# Patient Record
Sex: Male | Born: 1975 | Race: White | Hispanic: No | Marital: Married | Smoking: Never smoker
Health system: Southern US, Community
[De-identification: ages and names within clinical notes are randomized; demographics above are authoritative.]

## PROBLEM LIST (undated history)

## (undated) DIAGNOSIS — J189 Pneumonia, unspecified organism: Secondary | ICD-10-CM

## (undated) DIAGNOSIS — F329 Major depressive disorder, single episode, unspecified: Secondary | ICD-10-CM

## (undated) DIAGNOSIS — F32A Depression, unspecified: Secondary | ICD-10-CM

## (undated) HISTORY — DX: Major depressive disorder, single episode, unspecified: F32.9

## (undated) HISTORY — PX: WISDOM TOOTH EXTRACTION: SHX21

## (undated) HISTORY — DX: Depression, unspecified: F32.A

---

## 2014-03-14 ENCOUNTER — Ambulatory Visit (INDEPENDENT_AMBULATORY_CARE_PROVIDER_SITE_OTHER): Payer: No Typology Code available for payment source | Admitting: Family

## 2014-03-14 ENCOUNTER — Encounter: Payer: Self-pay | Admitting: Family

## 2014-03-14 ENCOUNTER — Ambulatory Visit (INDEPENDENT_AMBULATORY_CARE_PROVIDER_SITE_OTHER): Payer: No Typology Code available for payment source | Admitting: *Deleted

## 2014-03-14 VITALS — BP 118/90 | HR 64 | Temp 97.6°F | Resp 16 | Ht 68.75 in | Wt 186.8 lb

## 2014-03-14 DIAGNOSIS — F329 Major depressive disorder, single episode, unspecified: Secondary | ICD-10-CM

## 2014-03-14 DIAGNOSIS — Z23 Encounter for immunization: Secondary | ICD-10-CM

## 2014-03-14 DIAGNOSIS — K429 Umbilical hernia without obstruction or gangrene: Secondary | ICD-10-CM

## 2014-03-14 DIAGNOSIS — F32A Depression, unspecified: Secondary | ICD-10-CM

## 2014-03-14 MED ORDER — CITALOPRAM HYDROBROMIDE 20 MG PO TABS: 20.0000 mg | ORAL_TABLET | Freq: Every day | ORAL | Status: DC

## 2014-03-14 NOTE — Assessment & Plan Note (Signed)
New. Will refer to general surgeon for possible repair. Reviewed signs/symptoms of strangulated hernia and pt is advised to go to the ED if this occurs.

## 2014-03-14 NOTE — Progress Notes (Signed)
Pre visit review using our clinic review tool, if applicable. No additional management support is needed unless otherwise documented below in the visit note. 

## 2014-03-14 NOTE — Assessment & Plan Note (Signed)
Stable on citalopram, continue same.  

## 2014-03-14 NOTE — Progress Notes (Signed)
Subjective:    Patient ID: Brad Hill, male    DOB: 1975-05-23, 38 y.o.   MRN: [id number]  HPI  Brad Hill is a 38 yr old male who presents today to establish care.   "lump" above navel- reports that he first noticed this about 6 weeks ago. Reports pain worsens with bending to tie shoe.  Denies associated nausea/vomitting. Severity is minor.   Depression- reports that he has been on it for 4 years.  Tried to stop 4 years ago but wife noted that he was "gloomy". Reports feeling well. Denies panic attacks.     Review of Systems  Constitutional: Negative for unexpected weight change.  HENT: Negative for rhinorrhea.   Respiratory: Negative for cough.   Gastrointestinal: Negative for nausea, vomiting and diarrhea.  Genitourinary: Negative for dysuria and frequency.  Musculoskeletal: Positive for joint swelling. Negative for arthralgias.  Skin: Negative for rash.  Psychiatric/Behavioral:       See HPI   Past Medical History  Diagnosis Date  . Depression     History   Social History  . Marital Status: Married    Spouse Name: N/A    Number of Children: N/A  . Years of Education: N/A   Occupational History  . Not on file.   Social History Main Topics  . Smoking status: Never Smoker   . Smokeless tobacco: Never Used  . Alcohol Use: 0.0 oz/week    0 Not specified per week     Comment: occasional "less than once a week"  . Drug Use: Not on file  . Sexual Activity: Not on file   Other Topics Concern  . 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 Wisdom tooth extraction      Family History  Problem Relation Age of Onset  . Hypertension Mother   . Cancer Father     prostate-  living, s/p surgery    Allergies  Allergen Reactions  . Erythromycin Nausea Only    No current outpatient prescriptions on file prior to visit.   No current facility-administered medications on file prior to visit.    BP 118/90 mmHg  Pulse 64  Temp(Src) 97.6 F (36.4 C) (Oral)  Resp 16  Ht 5' 8.75" (1.746 m)  Wt 186 lb 12.8 oz (84.732 kg)  BMI 27.79 kg/m2  SpO2 98%       Objective:   Physical Exam  Constitutional: He is oriented to person, place, and time. He appears well-developed and well-nourished. No distress.  HENT:  Head: Normocephalic and atraumatic.  Cardiovascular: Normal rate and regular rhythm.   No murmur heard. Pulmonary/Chest: Effort normal and breath sounds normal. No respiratory distress. He has no wheezes. He has no rales. He exhibits no tenderness.  Abdominal: Soft. He exhibits no distension. There is no tenderness.  Small reducible umbilical hernia is noted  Neurological: He is alert and oriented to person, place, and time.  Skin: Skin is warm and dry.  Psychiatric: He has a normal mood and affect. His behavior is normal. Judgment and thought content normal.         Assessment & Plan:

## 2014-03-14 NOTE — Patient Instructions (Signed)
You will be contacted about your referral to see the Surgeon. Please schedule a fasting physical at the front desk. In regards to your hernia- please go to ER if severe abdominal pain/nausea/vomitting occurs. Welcome to [facility]!

## 2014-04-29 HISTORY — PX: UMBILICAL HERNIA REPAIR: SHX196

## 2014-05-11 ENCOUNTER — Encounter: Payer: No Typology Code available for payment source | Admitting: Family

## 2014-09-05 ENCOUNTER — Other Ambulatory Visit: Payer: Self-pay | Admitting: Family

## 2014-09-06 NOTE — Telephone Encounter (Signed)
Pt last seen by PCP 02/2014 and advised fasting physical. Pt has not scheduled.  Please call pt to scheduled fasting physical at his earliest convenience.  Thanks!

## 2014-09-06 NOTE — Telephone Encounter (Signed)
Left message advising pt to schedule physical appointment awaiting call

## 2014-09-13 NOTE — Telephone Encounter (Signed)
Mailed letter to pt

## 2014-11-22 ENCOUNTER — Ambulatory Visit (INDEPENDENT_AMBULATORY_CARE_PROVIDER_SITE_OTHER): Payer: No Typology Code available for payment source | Admitting: Family

## 2014-11-22 ENCOUNTER — Ambulatory Visit (HOSPITAL_BASED_OUTPATIENT_CLINIC_OR_DEPARTMENT_OTHER)
Admission: RE | Admit: 2014-11-22 | Discharge: 2014-11-22 | Disposition: A | Discharge status: Home / Self Care | Payer: No Typology Code available for payment source | Source: Ambulatory Visit | Attending: Family | Admitting: Family

## 2014-11-22 ENCOUNTER — Encounter: Payer: Self-pay | Admitting: Family

## 2014-11-22 VITALS — BP 122/90 | HR 68 | Temp 98.0°F | Resp 16 | Ht 69.0 in | Wt 192.8 lb

## 2014-11-22 DIAGNOSIS — M546 Pain in thoracic spine: Secondary | ICD-10-CM | POA: Diagnosis not present

## 2014-11-22 MED ORDER — MELOXICAM 7.5 MG PO TABS: 7.5000 mg | ORAL_TABLET | Freq: Every day | ORAL | Status: DC

## 2014-11-22 NOTE — Progress Notes (Signed)
Pre visit review using our clinic review tool, if applicable. No additional management support is needed unless otherwise documented below in the visit note. 

## 2014-11-22 NOTE — Patient Instructions (Signed)
Please complete x ray on the first floor. Start meloxicam (anti-inflammatory). Call if back pain worsens or if not improved in 1 month.  Schedule physical at the front desk.

## 2014-11-22 NOTE — Assessment & Plan Note (Signed)
X ray of thoracic spine is unremarkable. Trial of meloxicam, pt to follow up if symptoms worsen or do not improve.

## 2014-11-22 NOTE — Progress Notes (Signed)
   Subjective:    Patient ID: Brad Hill, male    DOB: Jan 19, 1976, 39 y.o.   MRN: [id number]  HPI  Mr. Brad Hill is a 39 yr old male tho presents today with complaint of back pain.  Reports that he develops back pain center of his back with prolonged sitting.  Feels like something is going "right through is back" to above the navel.  First noticed these symptoms a few months ago. Pain is intermittent.  Improved with standing.   Review of Systems See HPI  Past Medical History  Diagnosis Date  . Depression     History   Social History  . Marital Status: Married    Spouse Name: N/A  . Number of Children: N/A  . Years of Education: N/A   Occupational History  . Not on file.   Social History Main Topics  . Smoking status: Never Smoker   . Smokeless tobacco: Never Used  . Alcohol Use: 0.0 oz/week    0 Standard drinks or equivalent per week     Comment: occasional "less than once a week"  . Drug Use: Not on file  . Sexual Activity: Not on file   Other Topics Concern  . 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 Wisdom tooth extraction    . Umbilical hernia repair  1/16    Family History  Problem Relation Age of Onset  . Hypertension Mother   . Cancer Father     prostate- living, s/p surgery    Allergies  Allergen Reactions  . Erythromycin Nausea Only    Current Outpatient Prescriptions on File Prior to Visit  Medication Sig Dispense Refill  . citalopram (CELEXA) 20 MG tablet TAKE ONE TABLET BY MOUTH ONE TIME DAILY 90 tablet 0   No current facility-administered medications on file prior to visit.    BP 122/90 mmHg  Pulse 68  Temp(Src) 98 F (36.7 C) (Oral)  Resp 16  Ht 5'  9" (1.753 m)  Wt 192 lb 12.8 oz (87.454 kg)  BMI 28.46 kg/m2  SpO2 98%       Objective:   Physical Exam  Constitutional: He is oriented to person, place, and time. He appears well-developed and well-nourished. No distress.  HENT:  Head: Normocephalic and atraumatic.  Cardiovascular: Normal rate and regular rhythm.   No murmur heard. Pulmonary/Chest: Effort normal and breath sounds normal. No respiratory distress. He has no wheezes. He has no rales.  Abdominal: Soft. He exhibits no distension and no mass. There is no tenderness.  Musculoskeletal: He exhibits no edema.  Mild tenderness right side of lower thoracic spine Neg straight leg raise bilaterally  Neurological: He is alert and oriented to person, place, and time.  Reflex Scores:      Patellar reflexes are 3+ on the right side and 3+ on the left side. Bilateral LE strength is 5/5  Skin: Skin is warm and dry.  Psychiatric: He has a normal mood and affect. His behavior is normal. Thought content normal.          Assessment & Plan:

## 2014-12-12 ENCOUNTER — Ambulatory Visit (INDEPENDENT_AMBULATORY_CARE_PROVIDER_SITE_OTHER): Payer: No Typology Code available for payment source | Admitting: Family

## 2014-12-12 ENCOUNTER — Encounter: Payer: Self-pay | Admitting: Family

## 2014-12-12 VITALS — BP 120/88 | HR 85 | Temp 97.8°F | Resp 16 | Ht 69.5 in | Wt 191.2 lb

## 2014-12-12 DIAGNOSIS — F329 Major depressive disorder, single episode, unspecified: Secondary | ICD-10-CM

## 2014-12-12 DIAGNOSIS — R7989 Other specified abnormal findings of blood chemistry: Secondary | ICD-10-CM | POA: Diagnosis not present

## 2014-12-12 DIAGNOSIS — Z23 Encounter for immunization: Secondary | ICD-10-CM

## 2014-12-12 DIAGNOSIS — Z0000 Encounter for general adult medical examination without abnormal findings: Secondary | ICD-10-CM

## 2014-12-12 DIAGNOSIS — F32A Depression, unspecified: Secondary | ICD-10-CM

## 2014-12-12 LAB — URINALYSIS, ROUTINE W REFLEX MICROSCOPIC
Bilirubin Urine: NEGATIVE
KETONES UR: NEGATIVE
LEUKOCYTES UA: NEGATIVE
NITRITE: NEGATIVE
Specific Gravity, Urine: 1.01 (ref 1.000–1.030)
TOTAL PROTEIN, URINE-UPE24: NEGATIVE
URINE GLUCOSE: NEGATIVE
UROBILINOGEN UA: 0.2 (ref 0.0–1.0)
WBC, UA: NONE SEEN (ref 0–?)
pH: 6 (ref 5.0–8.0)

## 2014-12-12 LAB — LIPID PANEL
CHOL/HDL RATIO: 7
Cholesterol: 223 mg/dL — ABNORMAL HIGH (ref 0–200)
HDL: 33.9 mg/dL — AB (ref >39.00)
Triglycerides: 457 mg/dL — ABNORMAL HIGH (ref 0.0–149.0)

## 2014-12-12 LAB — HEPATIC FUNCTION PANEL
ALBUMIN: 4.5 g/dL (ref 3.5–5.2)
ALT: 35 U/L (ref 0–53)
AST: 27 U/L (ref 0–37)
Alkaline Phosphatase: 77 U/L (ref 39–117)
BILIRUBIN TOTAL: 0.5 mg/dL (ref 0.2–1.2)
Bilirubin, Direct: 0.1 mg/dL (ref 0.0–0.3)
Total Protein: 7.5 g/dL (ref 6.0–8.3)

## 2014-12-12 LAB — BASIC METABOLIC PANEL
BUN: 13 mg/dL (ref 6–23)
CHLORIDE: 104 meq/L (ref 96–112)
CO2: 27 mEq/L (ref 19–32)
CREATININE: 0.97 mg/dL (ref 0.40–1.50)
Calcium: 9.3 mg/dL (ref 8.4–10.5)
GFR: 91.12 mL/min (ref >60.00)
GLUCOSE: 84 mg/dL (ref 70–99)
Potassium: 3.8 mEq/L (ref 3.5–5.1)
Sodium: 138 mEq/L (ref 135–145)

## 2014-12-12 LAB — CBC WITH DIFFERENTIAL/PLATELET
Basophils Absolute: 0 10*3/uL (ref 0.0–0.1)
Basophils Relative: 0.4 % (ref 0.0–3.0)
EOS ABS: 0.2 10*3/uL (ref 0.0–0.7)
Eosinophils Relative: 2.9 % (ref 0.0–5.0)
HCT: 44.3 % (ref 39.0–52.0)
HEMOGLOBIN: 15 g/dL (ref 13.0–17.0)
Lymphocytes Relative: 32.9 % (ref 12.0–46.0)
Lymphs Abs: 2.5 10*3/uL (ref 0.7–4.0)
MCHC: 33.9 g/dL (ref 30.0–36.0)
MCV: 87.1 fl (ref 78.0–100.0)
MONO ABS: 0.7 10*3/uL (ref 0.1–1.0)
Monocytes Relative: 9.5 % (ref 3.0–12.0)
Neutro Abs: 4.2 10*3/uL (ref 1.4–7.7)
Neutrophils Relative %: 54.3 % (ref 43.0–77.0)
PLATELETS: 315 10*3/uL (ref 150.0–400.0)
RBC: 5.09 Mil/uL (ref 4.22–5.81)
RDW: 13 % (ref 11.5–15.5)
WBC: 7.7 10*3/uL (ref 4.0–10.5)

## 2014-12-12 LAB — LDL CHOLESTEROL, DIRECT: Direct LDL: 95 mg/dL

## 2014-12-12 LAB — TSH: TSH: 1.14 u[IU]/mL (ref 0.35–4.50)

## 2014-12-12 NOTE — Addendum Note (Signed)
Addended by: Mervin Kung A on: 12/12/2014 04:11 PM   Modules accepted: Orders

## 2014-12-12 NOTE — Progress Notes (Signed)
Subjective:    Patient ID: Brad Hill, male    DOB: Sep 02, 1975, 39 y.o.   MRN: [id number]  HPI  Patient presents today for complete physical.  Immunizations:tdap due Diet: fair, needs to eat more veggies/fruit Exercise: some but not  Dad had prostate CA at age 16.  Both grandfathers had prostate cancer in their 80's.    Reports that he had a stressful life event 4-5 years ago and started citalopram.  Weaned himself off for 6-9 months 1 year ago.  Wife thought that he needed to go back on "was mopey."    Review of Systems  Constitutional: Negative for unexpected weight change.  HENT: Negative for rhinorrhea.   Respiratory: Negative for shortness of breath.        Reports mild cough last couple of days  Cardiovascular: Negative for chest pain.  Gastrointestinal: Negative for diarrhea, constipation and blood in stool.  Genitourinary: Negative for dysuria and frequency.  Musculoskeletal: Negative for myalgias and arthralgias.       Saw chiropractor for back pain- chiropractor was concerned if it could be gallbladder  Skin: Negative for rash.  Neurological:       Rare headaches  Hematological: Negative for adenopathy.  Psychiatric/Behavioral:       Denies depression/anxiety    Past Medical History  Diagnosis Date  . Depression     Social History   Social History  . Marital Status: Married    Spouse Name: N/A  . Number of Children: N/A  . Years of Education: N/A   Occupational History  . Not on file.   Social History Main Topics  . Smoking status: Never Smoker   . Smokeless tobacco: Never Used  . Alcohol Use: 0.0 oz/week    0 Standard drinks or equivalent per week     Comment: occasional "less than once a week"  . Drug Use: Not on file  . Sexual Activity: Not on file   Other Topics Concern  . 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 Wisdom tooth extraction    . Umbilical hernia repair  1/16    Family History  Problem Relation Age of Onset  . Hypertension Mother   . Cancer Father 30    prostate- living, s/p surgery  . Cancer Maternal Grandfather 6    prostate  . Cancer Paternal Grandfather 47    prostate     Allergies  Allergen Reactions  . Erythromycin Nausea Only    Current Outpatient Prescriptions on File Prior to Visit  Medication Sig Dispense Refill  . citalopram (CELEXA) 20 MG tablet TAKE ONE TABLET BY MOUTH ONE TIME DAILY 90 tablet 0  . meloxicam (MOBIC) 7.5 MG tablet Take 1 tablet (7.5 mg total) by mouth daily. 14 tablet 0   No current facility-administered medications on file prior to visit.    BP 120/88 mmHg  Pulse 85  Temp(Src) 97.8 F (36.6 C) (Oral)  Resp 16  Ht 5' 9.5" (1.765 m)  Wt 191 lb 3.2 oz (86.728 kg)  BMI 27.84 kg/m2  SpO2 98%       Objective:   Physical Exam  Physical Exam  Constitutional: He is oriented to person, place, and time. He appears well-developed and well-nourished. No distress.  HENT:  Head: Normocephalic and atraumatic.  Right Ear: Tympanic membrane and ear canal normal.  Left Ear: Tympanic membrane and ear canal normal.  Mouth/Throat: Oropharynx is clear and moist.  Eyes: Pupils are equal, round, and reactive to light. No scleral icterus.  Neck: Normal range of motion. No thyromegaly present.  Cardiovascular: Normal rate and regular rhythm.   No murmur heard. Pulmonary/Chest: Effort normal and breath sounds normal. No respiratory distress. He has no wheezes. He has no rales. He exhibits no tenderness.  Abdominal: Soft. Bowel sounds are normal. He exhibits no distension and no mass. There is no tenderness. There is no rebound and no guarding.  Musculoskeletal: He exhibits no edema.    Lymphadenopathy:    He has no cervical adenopathy.  Neurological: He is alert and oriented to person, place, and time. He has normal patellar reflexes. He exhibits normal muscle tone. Coordination normal.  Skin: Skin is warm and dry.  Psychiatric: He has a normal mood and affect. His behavior is normal. Judgment and thought content normal.          Assessment & Plan:         Assessment & Plan:

## 2014-12-12 NOTE — Assessment & Plan Note (Signed)
Immunizations reviewed- Tdap given today. Discussed healthy diet, exercise, weight loss.  Obtain routine labs.Pt requests PSA.

## 2014-12-12 NOTE — Assessment & Plan Note (Signed)
He wishes to continue citalopram. Advised pt to let me know if he decides to try to come off in the future and we can arrange a taper plan.

## 2014-12-12 NOTE — Progress Notes (Signed)
Pre visit review using our clinic review tool, if applicable. No additional management support is needed unless otherwise documented below in the visit note. 

## 2014-12-12 NOTE — Patient Instructions (Signed)
Try to get 30 minutes of exercise 5 days a week. Work on [employer], more fruits and veggies, less soda. Please complete lab work prior to leaving.

## 2014-12-17 ENCOUNTER — Telehealth: Payer: Self-pay | Admitting: Family

## 2014-12-17 DIAGNOSIS — E781 Pure hyperglyceridemia: Secondary | ICD-10-CM | POA: Insufficient documentation

## 2014-12-17 MED ORDER — ATORVASTATIN CALCIUM 20 MG PO TABS: 20.0000 mg | ORAL_TABLET | Freq: Every day | ORAL | Status: DC

## 2014-12-17 NOTE — Telephone Encounter (Signed)
Triglycerides are very elevated. I would like pt to start atorvastatin and dietary changes:  work on avoiding concentrated sweets, and limiting white carbs (rice/bread/pasta/potatoes). Instead substitute whole grain versions with reasonable portions. Repeat flp in 3 months, dx hypertriglyceridemia.  Still waiting on PSA, did lab add this on?

## 2014-12-19 NOTE — Telephone Encounter (Signed)
PSA not run.  Too late to add. Will ask pt to return for redraw.

## 2014-12-20 NOTE — Telephone Encounter (Signed)
Notified pt of below results and he voices understanding. Scheduled lab appt for 03/28/15 at 7am.  Future lab orders entered. Per pt, he asked the lab to cancel PSA until he checked for coverage with his insurance company.  States he will call us back when he is ready to schedule the lab draw.

## 2015-01-09 ENCOUNTER — Other Ambulatory Visit: Payer: Self-pay | Admitting: Family

## 2015-03-28 ENCOUNTER — Other Ambulatory Visit: Payer: No Typology Code available for payment source

## 2015-04-20 ENCOUNTER — Other Ambulatory Visit: Payer: Self-pay | Admitting: Family

## 2015-05-17 ENCOUNTER — Other Ambulatory Visit: Payer: Self-pay | Admitting: Family

## 2015-06-14 ENCOUNTER — Ambulatory Visit: Payer: No Typology Code available for payment source | Admitting: Family

## 2015-06-20 ENCOUNTER — Ambulatory Visit (INDEPENDENT_AMBULATORY_CARE_PROVIDER_SITE_OTHER): Payer: BLUE CROSS/BLUE SHIELD | Admitting: Family

## 2015-06-20 ENCOUNTER — Other Ambulatory Visit: Payer: Self-pay | Admitting: Family

## 2015-06-20 ENCOUNTER — Encounter: Payer: Self-pay | Admitting: Family

## 2015-06-20 VITALS — BP 128/84 | HR 66 | Temp 97.4°F | Resp 16 | Ht 69.5 in | Wt 193.8 lb

## 2015-06-20 DIAGNOSIS — F329 Major depressive disorder, single episode, unspecified: Secondary | ICD-10-CM

## 2015-06-20 DIAGNOSIS — F32A Depression, unspecified: Secondary | ICD-10-CM

## 2015-06-20 DIAGNOSIS — Z8042 Family history of malignant neoplasm of prostate: Secondary | ICD-10-CM | POA: Diagnosis not present

## 2015-06-20 DIAGNOSIS — E785 Hyperlipidemia, unspecified: Secondary | ICD-10-CM | POA: Diagnosis not present

## 2015-06-20 DIAGNOSIS — E781 Pure hyperglyceridemia: Secondary | ICD-10-CM

## 2015-06-20 LAB — PSA: PSA: 0.72 ng/mL (ref 0.10–4.00)

## 2015-06-20 LAB — LIPID PANEL
CHOL/HDL RATIO: 4
Cholesterol: 151 mg/dL (ref 0–200)
HDL: 34.1 mg/dL — AB (ref >39.00)
NonHDL: 117.36
Triglycerides: 284 mg/dL — ABNORMAL HIGH (ref 0.0–149.0)
VLDL: 56.8 mg/dL — AB (ref 0.0–40.0)

## 2015-06-20 LAB — LDL CHOLESTEROL, DIRECT: LDL DIRECT: 72 mg/dL

## 2015-06-20 MED ORDER — CITALOPRAM HYDROBROMIDE 20 MG PO TABS: 20.0000 mg | ORAL_TABLET | Freq: Every day | ORAL | Status: DC

## 2015-06-20 MED ORDER — ATORVASTATIN CALCIUM 20 MG PO TABS: ORAL_TABLET | ORAL | Status: DC

## 2015-06-20 NOTE — Patient Instructions (Signed)
Please complete lab work prior to leaving.   

## 2015-06-20 NOTE — Assessment & Plan Note (Signed)
Obtain follow up lipid panel.  

## 2015-06-20 NOTE — Assessment & Plan Note (Signed)
Stable on citalopram, continue same.  

## 2015-06-20 NOTE — Progress Notes (Signed)
Pre visit review using our clinic review tool, if applicable. No additional management support is needed unless otherwise documented below in the visit note. 

## 2015-06-20 NOTE — Telephone Encounter (Signed)
Trigs are improving but still elevated.  Add tricor, add fish oil  bid, continue to work on dietary changes. Repeat in 3 mos fasting.

## 2015-06-20 NOTE — Progress Notes (Signed)
Subjective:    Patient ID: Brad Hill, male    DOB: 06-30-1975, 40 y.o.   MRN: [id number]  HPI  Pt presents today for follow up of his depression.  Reports that he continues citalopram and depression remains well controlled.   Hyperlipidemia- denies myalgia, tolerating lipitor. Reports that he has decreased soda intake.  Not exercising.  Lab Results  Component Value Date   CHOL 223* 12/12/2014   HDL 33.90* 12/12/2014   LDLDIRECT 95.0 12/12/2014   TRIG * 12/12/2014    457.0 Triglyceride is over 400; calculations on Lipids are invalid.   CHOLHDL 7 12/12/2014   Reports dad had prostate CA in his 74's pt would like PSA checked.    Review of Systems    see HPI  Past Medical History  Diagnosis Date  . Depression     Social History   Social History  . Marital Status: Married    Spouse Name: N/A  . Number of Children: N/A  . Years of Education: N/A   Occupational History  . Not on file.   Social History Main Topics  . Smoking status: Never Smoker   . Smokeless tobacco: Never Used  . Alcohol Use: 0.0 oz/week    0 Standard drinks or equivalent per week     Comment: occasional "less than once a week"  . Drug Use: Not on file  . Sexual Activity: Not on file   Other Topics Concern  . 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 Wisdom tooth extraction    . Umbilical hernia repair  1/16    Family History  Problem Relation Age of Onset  . Hypertension Mother   . Cancer Father 73    prostate- living, s/p surgery  . Cancer Maternal Grandfather 55    prostate  . Cancer Paternal Grandfather 75    prostate     Allergies  Allergen Reactions  . Erythromycin Nausea Only     Current Outpatient Prescriptions on File Prior to Visit  Medication Sig Dispense Refill  . atorvastatin (LIPITOR) 20 MG tablet TAKE 1 TABLET (20 MG TOTAL) BY MOUTH DAILY. 30 tablet 0  . citalopram (CELEXA) 20 MG tablet TAKE ONE TABLET BY MOUTH ONE TIME DAILY 90 tablet 1   No current facility-administered medications on file prior to visit.    BP 128/84 mmHg  Pulse 66  Temp(Src) 97.4 F (36.3 C) (Oral)  Resp 16  Ht 5' 9.5" (1.765 m)  Wt 193 lb 12.8 oz (87.907 kg)  BMI 28.22 kg/m2  SpO2 98%    Objective:   Physical Exam  Constitutional: He is oriented to person, place, and time. He appears well-developed and well-nourished. No distress.  HENT:  Head: Normocephalic and atraumatic.  Cardiovascular: Normal rate and regular rhythm.   No murmur heard. Pulmonary/Chest: Effort normal and breath sounds normal. No respiratory distress. He has no wheezes. He has no rales.  Musculoskeletal: He exhibits no edema.  Neurological: He is alert and oriented to person, place, and time.  Skin: Skin is warm and dry.  Psychiatric: He has a normal mood and affect. His behavior is normal.  Thought content normal.          Assessment & Plan:  Family hx of prostate CA- will check PSA at pt request- discussed Pro's/con's.

## 2015-06-21 MED ORDER — FENOFIBRATE 145 MG PO TABS: 145.0000 mg | ORAL_TABLET | Freq: Every day | ORAL | Status: DC

## 2015-06-21 NOTE — Telephone Encounter (Signed)
Notified pt and he voices understanding. Rx sent, lab appt scheduled for 09/18/15 at 8:45am and future order entered.

## 2015-09-18 ENCOUNTER — Encounter: Payer: Self-pay | Admitting: Family

## 2015-09-18 ENCOUNTER — Other Ambulatory Visit (INDEPENDENT_AMBULATORY_CARE_PROVIDER_SITE_OTHER): Payer: BLUE CROSS/BLUE SHIELD

## 2015-09-18 DIAGNOSIS — E781 Pure hyperglyceridemia: Secondary | ICD-10-CM | POA: Diagnosis not present

## 2015-09-18 LAB — LIPID PANEL
CHOLESTEROL: 158 mg/dL (ref 0–200)
HDL: 32.2 mg/dL — AB (ref >39.00)
LDL Cholesterol: 100 mg/dL — ABNORMAL HIGH (ref 0–99)
NonHDL: 125.65
TRIGLYCERIDES: 130 mg/dL (ref 0.0–149.0)
Total CHOL/HDL Ratio: 5
VLDL: 26 mg/dL (ref 0.0–40.0)

## 2015-09-27 DIAGNOSIS — Z23 Encounter for immunization: Secondary | ICD-10-CM | POA: Diagnosis not present

## 2015-12-13 ENCOUNTER — Encounter: Payer: Self-pay | Admitting: Family

## 2015-12-13 ENCOUNTER — Ambulatory Visit (INDEPENDENT_AMBULATORY_CARE_PROVIDER_SITE_OTHER): Payer: BLUE CROSS/BLUE SHIELD | Admitting: Family

## 2015-12-13 VITALS — BP 102/78 | HR 62 | Temp 97.5°F | Resp 17 | Ht 72.0 in | Wt 189.4 lb

## 2015-12-13 DIAGNOSIS — Z0000 Encounter for general adult medical examination without abnormal findings: Secondary | ICD-10-CM | POA: Diagnosis not present

## 2015-12-13 LAB — URINALYSIS, ROUTINE W REFLEX MICROSCOPIC
Bilirubin Urine: NEGATIVE
HGB URINE DIPSTICK: NEGATIVE
KETONES UR: NEGATIVE
LEUKOCYTES UA: NEGATIVE
Nitrite: NEGATIVE
SPECIFIC GRAVITY, URINE: 1.02 (ref 1.000–1.030)
Total Protein, Urine: NEGATIVE
URINE GLUCOSE: NEGATIVE
UROBILINOGEN UA: 0.2 (ref 0.0–1.0)
pH: 6 (ref 5.0–8.0)

## 2015-12-13 LAB — BASIC METABOLIC PANEL
BUN: 17 mg/dL (ref 6–23)
CO2: 31 meq/L (ref 19–32)
Calcium: 10.1 mg/dL (ref 8.4–10.5)
Chloride: 102 mEq/L (ref 96–112)
Creatinine, Ser: 1.1 mg/dL (ref 0.40–1.50)
GFR: 78.42 mL/min (ref >60.00)
GLUCOSE: 84 mg/dL (ref 70–99)
POTASSIUM: 4.3 meq/L (ref 3.5–5.1)
SODIUM: 140 meq/L (ref 135–145)

## 2015-12-13 LAB — HEPATIC FUNCTION PANEL
ALBUMIN: 4.6 g/dL (ref 3.5–5.2)
ALT: 32 U/L (ref 0–53)
AST: 25 U/L (ref 0–37)
Alkaline Phosphatase: 65 U/L (ref 39–117)
Bilirubin, Direct: 0.1 mg/dL (ref 0.0–0.3)
Total Bilirubin: 0.4 mg/dL (ref 0.2–1.2)
Total Protein: 7.4 g/dL (ref 6.0–8.3)

## 2015-12-13 LAB — CBC WITH DIFFERENTIAL/PLATELET
Basophils Absolute: 0 10*3/uL (ref 0.0–0.1)
Basophils Relative: 0.5 % (ref 0.0–3.0)
EOS PCT: 2.3 % (ref 0.0–5.0)
Eosinophils Absolute: 0.2 10*3/uL (ref 0.0–0.7)
HCT: 43.2 % (ref 39.0–52.0)
Hemoglobin: 14.4 g/dL (ref 13.0–17.0)
LYMPHS ABS: 2.9 10*3/uL (ref 0.7–4.0)
Lymphocytes Relative: 38 % (ref 12.0–46.0)
MCHC: 33.3 g/dL (ref 30.0–36.0)
MCV: 86.7 fl (ref 78.0–100.0)
MONOS PCT: 9.3 % (ref 3.0–12.0)
Monocytes Absolute: 0.7 10*3/uL (ref 0.1–1.0)
NEUTROS ABS: 3.9 10*3/uL (ref 1.4–7.7)
NEUTROS PCT: 49.9 % (ref 43.0–77.0)
PLATELETS: 327 10*3/uL (ref 150.0–400.0)
RBC: 4.98 Mil/uL (ref 4.22–5.81)
RDW: 12.8 % (ref 11.5–15.5)
WBC: 7.8 10*3/uL (ref 4.0–10.5)

## 2015-12-13 LAB — TSH: TSH: 0.99 u[IU]/mL (ref 0.35–4.50)

## 2015-12-13 NOTE — Progress Notes (Signed)
Subjective:    Patient ID: Brad Hill, male    DOB: 1976/02/04, 40 y.o.   MRN: [id number]  HPI  Patient presents today for complete physical.  Immunizations:  Tetanus up to date Diet: reports diet has improved.   Exercise: some exercise, walks with Colonoscopy:  Will begin at [address]: due Dental: up to date  Wt Readings from Last 3 Encounters:  12/13/15 189 lb 6.4 oz (85.9 kg)  06/20/15 193 lb 12.8 oz (87.9 kg)  12/12/14 191 lb 3.2 oz (86.7 kg)     Review of Systems  Constitutional: Negative for unexpected weight change.  HENT: Negative for hearing loss and rhinorrhea.   Eyes: Negative for visual disturbance.  Respiratory: Negative for cough and shortness of breath.   Cardiovascular: Negative for chest pain and leg swelling.  Gastrointestinal: Negative for constipation and diarrhea.  Genitourinary: Negative for dysuria and frequency.  Musculoskeletal: Negative for arthralgias and myalgias.  Skin: Negative for rash.  Neurological: Negative for headaches.  Psychiatric/Behavioral:       Denies depression/anxiety       Past Medical History:  Diagnosis Date  . Depression      Social History   Social History  . Marital status: Married    Spouse name: N/A  . Number of children: N/A  . Years of education: N/A   Occupational History  . Not on file.   Social History Main Topics  . Smoking status: Never Smoker  . Smokeless tobacco: Never Used  . Alcohol use 0.0 oz/week     Comment: occasional "less than once a week"  . Drug use: Unknown  . Sexual activity: Not on file   Other Topics Concern  . 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  UMBILICAL HERNIA REPAIR  1/16  . WISDOM TOOTH EXTRACTION      Family History  Problem Relation Age of Onset  . Hypertension Mother   . Cancer Father 4057    prostate- living, s/p surgery  . Cancer Maternal Grandfather 1780    prostate  . Cancer Paternal Grandfather [id number]    prostate     Allergies  Allergen Reactions  . Erythromycin Nausea Only    Current Outpatient Prescriptions on File Prior to Visit  Medication Sig Dispense Refill  . atorvastatin (LIPITOR) 20 MG tablet TAKE 1 TABLET (20 MG TOTAL) BY MOUTH DAILY. 90 tablet 1  . citalopram (CELEXA) 20 MG tablet Take 1 tablet (20 mg total) by mouth daily. 90 tablet 1  . fenofibrate (TRICOR) 145 MG tablet Take 1 tablet (145 mg total) by mouth daily. 30 tablet 5  . Omega-3 Fatty Acids (FISH OIL) 1000 MG CAPS Take 2 capsules by mouth 2 (two) times daily.     No current facility-administered medications on file prior to visit.     BP 102/78 (BP Location: Left Arm, Patient Position: Sitting, Cuff Size: Normal)   Pulse 62   Temp 97.5 F (36.4 C) (Oral)   Resp 17   Ht 6' (1.829 m)   Wt 189 lb 6.4 oz (85.9 kg)   SpO2 95%   BMI  25.69 kg/m    Objective:   Physical Exam  Physical Exam  Constitutional: He is oriented to person, place, and time. He appears well-developed and well-nourished. No distress.  HENT:  Head: Normocephalic and atraumatic.  Right Ear: Tympanic membrane and ear canal normal.  Left Ear: Tympanic membrane and ear canal normal.  Mouth/Throat: Oropharynx is clear and moist.  Eyes: Pupils are equal, round, and reactive to light. No scleral icterus.  Neck: Normal range of motion. No thyromegaly present.  Cardiovascular: Normal rate and regular rhythm.   No murmur heard. Pulmonary/Chest: Effort normal and breath sounds normal. No respiratory distress. He has no wheezes. He has no rales. He exhibits no tenderness.  Abdominal: Soft. Bowel sounds are normal. He exhibits no distension and no mass. There is no  tenderness. There is no rebound and no guarding.  Musculoskeletal: He exhibits no edema.  Lymphadenopathy:    He has no cervical adenopathy.  Neurological: He is alert and oriented to person, place, and time. He has normal patellar reflexes. He exhibits normal muscle tone. Coordination normal.  Skin: Skin is warm and dry.  Psychiatric: He has a normal mood and affect. His behavior is normal. Judgment and thought content normal.          Assessment & Plan:   Preventative Care- discussed diet, exercise weight loss (goal weight loss 10 pounds).  Commended pt on improvement in his lipids.  Obtain routine lab work. Advised pt to schedule routine eye exam.          Assessment & Plan:        Assessment & Plan:  EKG tracing is personally reviewed.  EKG notes NSR.  No acute changes.

## 2015-12-13 NOTE — Progress Notes (Signed)
Pre visit review using our clinic review tool, if applicable. No additional management support is needed unless otherwise documented below in the visit note. 

## 2015-12-13 NOTE — Patient Instructions (Signed)
Please complete lab work prior to leaving. Try increase the walking to 30 minutes 5 days a week and continue healthy diet.

## 2015-12-14 ENCOUNTER — Encounter: Payer: Self-pay | Admitting: Family

## 2015-12-29 ENCOUNTER — Other Ambulatory Visit: Payer: Self-pay | Admitting: Family

## 2016-01-04 DIAGNOSIS — D225 Melanocytic nevi of trunk: Secondary | ICD-10-CM | POA: Diagnosis not present

## 2016-01-04 DIAGNOSIS — D220 Melanocytic nevi of lip: Secondary | ICD-10-CM | POA: Diagnosis not present

## 2016-01-04 DIAGNOSIS — D224 Melanocytic nevi of scalp and neck: Secondary | ICD-10-CM | POA: Diagnosis not present

## 2016-01-04 DIAGNOSIS — L821 Other seborrheic keratosis: Secondary | ICD-10-CM | POA: Diagnosis not present

## 2016-01-19 ENCOUNTER — Other Ambulatory Visit: Payer: Self-pay | Admitting: Family

## 2016-04-05 ENCOUNTER — Other Ambulatory Visit: Payer: Self-pay | Admitting: Family

## 2016-06-01 DIAGNOSIS — Z23 Encounter for immunization: Secondary | ICD-10-CM | POA: Diagnosis not present

## 2016-06-21 ENCOUNTER — Encounter: Payer: Self-pay | Admitting: Family Medicine

## 2016-06-21 ENCOUNTER — Ambulatory Visit (INDEPENDENT_AMBULATORY_CARE_PROVIDER_SITE_OTHER): Payer: BLUE CROSS/BLUE SHIELD | Admitting: Family Medicine

## 2016-06-21 VITALS — BP 118/74 | HR 79 | Temp 99.0°F | Resp 14 | Ht 72.0 in | Wt 192.1 lb

## 2016-06-21 DIAGNOSIS — R6889 Other general symptoms and signs: Secondary | ICD-10-CM

## 2016-06-21 NOTE — Patient Instructions (Signed)
Continue to push fluids, practice good hand hygiene, and cover your mouth if you cough.  If you start having persistent high fevers, shaking or shortness of breath, seek immediate care.

## 2016-06-21 NOTE — Progress Notes (Signed)
Chief Complaint  Patient presents with  . Cough    x1-2 days  . Nasal Congestion  . Generalized Body Aches    x5-6 days, "every symptom of the flu I've had"    Brad Hill here for URI complaints.  Duration: 6 days  Associated symptoms: fever (101.3 F) sinus congestion, rhinorrhea, myalgia and cough Denies: itchy watery eyes, ear pain, ear drainage, sore throat, shortness of breath and shaking Treatment to date: Tylenol Sick contacts: No  ROS:  Const: +fevers HEENT: As noted in HPI Lungs: No SOB  Past Medical History:  Diagnosis Date  . Depression    Family History  Problem Relation Age of Onset  . Hypertension Mother   . Cancer Father [id number]    prostate- living, s/p surgery  . Cancer Maternal Grandfather 7780    prostate  . Cancer Paternal Grandfather 9380    prostate     BP 118/74 (BP Location: Left Arm, Patient Position: Sitting, Cuff Size: Normal)   Pulse 79   Temp 99 F (37.2 C) (Oral)   Resp 14   Ht 6' (1.829 m)   Wt 192 lb 2 oz (87.1 kg)   SpO2 98%   BMI 26.06 kg/m  General: Awake, alert, appears stated age HEENT: AT, [place], ears patent b/l and TM's neg, nares patent w/o discharge, no sinus tenderness, pharynx pink and without exudates, MMM Neck: No masses or asymmetry Heart: RRR, no murmurs, no bruits Lungs: CTAB, no accessory muscle use Psych: Age appropriate judgment and insight, normal mood and affect  Flu-like symptoms  Offered cough suppressant. Discussed utility of Tamiflu and how it is not likely to be helpful at this stage. Continue to push fluids, practice good hand hygiene, cover mouth when coughing. Ibuprofen and acetaminophen.  F/u prn. If starting to experience high and persistent fevers, shaking, or shortness of breath, seek immediate care. Pt voiced understanding and agreement to the plan.  Brad Rocheicholas Paul [place], DO 06/21/16 10:37 AM

## 2016-06-21 NOTE — Progress Notes (Signed)
Pre visit review using our clinic review tool, if applicable. No additional management support is needed unless otherwise documented below in the visit note. 

## 2016-06-22 ENCOUNTER — Encounter (HOSPITAL_COMMUNITY): Payer: Self-pay | Admitting: Nurse Practitioner

## 2016-06-22 ENCOUNTER — Emergency Department (HOSPITAL_COMMUNITY): Payer: BLUE CROSS/BLUE SHIELD

## 2016-06-22 ENCOUNTER — Emergency Department (HOSPITAL_COMMUNITY)
Admission: EM | Admit: 2016-06-22 | Discharge: 2016-06-22 | Disposition: A | Discharge status: Home / Self Care | Payer: BLUE CROSS/BLUE SHIELD | Attending: Emergency Medicine | Admitting: Emergency Medicine

## 2016-06-22 DIAGNOSIS — R0602 Shortness of breath: Secondary | ICD-10-CM | POA: Diagnosis not present

## 2016-06-22 DIAGNOSIS — R05 Cough: Secondary | ICD-10-CM | POA: Diagnosis not present

## 2016-06-22 DIAGNOSIS — J189 Pneumonia, unspecified organism: Secondary | ICD-10-CM

## 2016-06-22 DIAGNOSIS — R9431 Abnormal electrocardiogram [ECG] [EKG]: Secondary | ICD-10-CM | POA: Diagnosis not present

## 2016-06-22 DIAGNOSIS — R509 Fever, unspecified: Secondary | ICD-10-CM | POA: Diagnosis not present

## 2016-06-22 DIAGNOSIS — R6889 Other general symptoms and signs: Secondary | ICD-10-CM | POA: Diagnosis not present

## 2016-06-22 DIAGNOSIS — R5383 Other fatigue: Secondary | ICD-10-CM | POA: Diagnosis not present

## 2016-06-22 HISTORY — DX: Pneumonia, unspecified organism: J18.9

## 2016-06-22 MED ORDER — DOXYCYCLINE HYCLATE 100 MG PO CAPS: 100.0000 mg | ORAL_CAPSULE | Freq: Two times a day (BID) | ORAL | 0 refills | Status: DC

## 2016-06-22 NOTE — ED Notes (Signed)
Zavitz MD at bedside. 

## 2016-06-22 NOTE — ED Triage Notes (Signed)
Pt presents with c/o flu like symptoms. The symptoms began 8 days ago. He feels like his symptoms are getting worse. He continues to have malaise, fevers around 102, chills, fatigue. Today he began to feel SOB after walking up the stairs so he decided to seek medical treatment. He has been taking aspirin and tylenol at home with fever reduction.

## 2016-06-22 NOTE — ED Provider Notes (Signed)
[facility]EM[facility]ENCY DEPT Provider Note   CSN: [id number] Arrival date & time: 06/22/16  1325     History   Chief Complaint Chief Complaint  Patient presents with  . Influenza    HPI Brad Hill is a 41 y.o. male.  Patient presents with flulike symptoms the past 8 days. Patient is concerned as he developed a higher fever and shortness of breath that he had not had a few days. With acute worsening taking the ER. No significant sick contacts. No immunosuppression history. No new medications. No chest pain or cardiac history, no history of blood clots no recent surgery no leg swelling or weight gain.      Past Medical History:  Diagnosis Date  . Depression   . Pneumonia     Patient Active Problem List   Diagnosis Date Noted  . Hypertriglyceridemia 12/17/2014  . Preventative health care 12/12/2014  . Thoracic back pain 11/22/2014  . Umbilical hernia 03/14/2014  . Depression 03/14/2014    Past Surgical History:  Procedure Laterality Date  . UMBILICAL HERNIA REPAIR  1/16  . WISDOM TOOTH EXTRACTION         Home Medications    Prior to Admission medications   Medication Sig Start Date End Date Taking? Authorizing Provider  atorvastatin (LIPITOR) 20 MG tablet TAKE 1 TABLET (20 MG TOTAL) BY MOUTH DAILY 01/19/16   Sandford Craze, NP  citalopram (CELEXA) 20 MG tablet TAKE 1 TABLET (20 MG TOTAL) BY MOUTH DAILY. 04/05/16   Sandford Craze, NP  doxycycline (VIBRAMYCIN) 100 MG capsule Take 1 capsule (100 mg total) by mouth 2 (two) times daily. One po bid x 7 days 06/22/16   Blane Ohara, MD  fenofibrate (TRICOR) 145 MG tablet TAKE 1 TABLET (145 MG TOTAL) BY MOUTH DAILY. 12/29/15   Sandford Craze, NP  Omega-3 Fatty Acids (FISH OIL) 1000 MG CAPS Take 2 capsules by mouth 2 (two) times daily.    Historical Provider, MD    Family History Family History  Problem Relation Age of Onset  . Hypertension Mother   . Cancer Father 60    prostate- living, s/p surgery  . Cancer  Maternal Grandfather 6    prostate  . Cancer Paternal Grandfather 59    prostate     Social History Social History  Substance Use Topics  . Smoking status: Never Smoker  . Smokeless tobacco: Never Used  . Alcohol use 0.0 oz/week     Comment: occasional "less than once a week"     Allergies   Erythromycin   Review of Systems Review of Systems  Constitutional: Positive for appetite change, chills and fever.  HENT: Negative for ear pain and sore throat.   Eyes: Negative for pain and visual disturbance.  Respiratory: Positive for cough and shortness of breath.   Cardiovascular: Negative for chest pain and palpitations.  Gastrointestinal: Negative for abdominal pain and vomiting.  Genitourinary: Negative for dysuria and hematuria.  Musculoskeletal: Negative for arthralgias and back pain.  Skin: Negative for color change and rash.  Neurological: Negative for seizures and syncope.  All other systems reviewed and are negative.    Physical Exam Updated Vital Signs BP 125/81   Pulse 67   Temp 98.6 F (37 C) (Oral)   Resp 16   Ht 5\' 9"  (1.753 m)   Wt 188 lb (85.3 kg)   SpO2 96%   BMI 27.76 kg/m   Physical Exam  Constitutional: He appears well-developed and well-nourished.  HENT:  Head: Normocephalic and atraumatic.  Eyes: Conjunctivae are normal.  Neck: Neck supple.  Cardiovascular: Normal rate and regular rhythm.   No murmur heard. Pulmonary/Chest: Effort normal and breath sounds normal. No respiratory distress.  Abdominal: Soft. There is no tenderness.  Musculoskeletal: He exhibits no edema.  Neurological: He is alert.  Skin: Skin is warm and dry.  Psychiatric: He has a normal mood and affect.  Nursing note and vitals reviewed.    ED Treatments / Results  Labs (all labs ordered are listed, but only abnormal results are displayed) Labs Reviewed - No data to display  EKG  EKG Interpretation None       Radiology Dg Chest 2 View  Result Date:  06/22/2016 CLINICAL DATA:  Shortness of breath today with cough, fever, chills, headaches and body aches. EXAM: CHEST  2 VIEW COMPARISON:  Thoracic spine 11/22/2014 FINDINGS: Lungs are adequately inflated with mild bibasilar linear density likely atelectasis, although cannot exclude early infection. No evidence of effusion. Cardiomediastinal silhouette, bones and soft tissues are normal. IMPRESSION: Mild bibasilar linear density likely atelectasis, although cannot exclude early infection. Electronically Signed   By: Elberta Fortisaniel  Boyle M.D.   On: 06/22/2016 14:20    Procedures Procedures (including critical care time)  Medications Ordered in ED Medications - No data to display   Initial Impression / Assessment and Plan / ED Course  I have reviewed the triage vital signs and the nursing notes.  Pertinent labs & imaging results that were available during my care of the patient were reviewed by me and considered in my medical decision making (see chart for details).    Patient presents with flulike symptoms. Clinical concern for bacterial pneumonia with acute worsening despite having influenza symptoms for 1 week. Chest x-ray did show mild density. Discussed trial of oral antibiotics and reassessment on Monday by his primary doctor. At this time not concern for myocarditis or other more significant infection. Patient in agreement with this plan.  Results and differential diagnosis were discussed with the patient/parent/guardian. Xrays were independently reviewed by myself.  Close follow up outpatient was discussed, comfortable with the plan.   Medications - No data to display  Vitals:   06/22/16 1334 06/22/16 1516 06/22/16 1530 06/22/16 1545  BP: 119/76 129/86 128/81 125/81  Pulse: 76  71 67  Resp: 16     Temp: 98.6 F (37 C)     TempSrc: Oral     SpO2: 92%  95% 96%  Weight: 188 lb (85.3 kg)     Height: 5\' 9"  (1.753 m)       Final diagnoses:  Community acquired pneumonia, unspecified  laterality     Final Clinical Impressions(s) / ED Diagnoses   Final diagnoses:  Community acquired pneumonia, unspecified laterality    New Prescriptions New Prescriptions   DOXYCYCLINE (VIBRAMYCIN) 100 MG CAPSULE    Take 1 capsule (100 mg total) by mouth 2 (two) times daily. One po bid x 7 days     Blane OharaJoshua Annita Ratliff, MD 06/22/16 [phone]

## 2016-06-22 NOTE — ED Notes (Signed)
Patient Alert and oriented X4. Stable and ambulatory. Patient verbalized understanding of the discharge instructions.  Patient belongings were taken by the patient.  

## 2016-06-22 NOTE — ED Notes (Signed)
Patient ambulated independently to the room.  Denies any SOB or tachycardia at this time.

## 2016-06-22 NOTE — Discharge Instructions (Signed)
If you were given medicines take as directed.  If you are on coumadin or contraceptives realize their levels and effectiveness is altered by many different medicines.  If you have any reaction (rash, tongues swelling, other) to the medicines stop taking and see a physician.    If your blood pressure was elevated in the ER make sure you follow up for management with a primary doctor or return for chest pain, shortness of breath or stroke symptoms.  Please follow up as directed and return to the ER or see a physician for new or worsening symptoms.  Thank you. Vitals:   06/22/16 1334 06/22/16 1516 06/22/16 1530 06/22/16 1545  BP: 119/76 129/86 128/81 125/81  Pulse: 76  71 67  Resp: 16     Temp: 98.6 F (37 C)     TempSrc: Oral     SpO2: 92%  95% 96%  Weight: 188 lb (85.3 kg)     Height: 5\' 9"  (1.753 m)

## 2016-06-23 ENCOUNTER — Telehealth: Payer: Self-pay | Admitting: Family

## 2016-06-23 NOTE — Telephone Encounter (Signed)
Please contact patient to arrange office visit with me on 2/26

## 2016-06-24 ENCOUNTER — Ambulatory Visit (INDEPENDENT_AMBULATORY_CARE_PROVIDER_SITE_OTHER): Payer: BLUE CROSS/BLUE SHIELD | Admitting: Family

## 2016-06-24 ENCOUNTER — Encounter: Payer: Self-pay | Admitting: Family

## 2016-06-24 VITALS — BP 118/75 | HR 78 | Temp 98.5°F | Resp 18 | Ht 69.0 in | Wt 190.4 lb

## 2016-06-24 DIAGNOSIS — J189 Pneumonia, unspecified organism: Secondary | ICD-10-CM | POA: Diagnosis not present

## 2016-06-24 NOTE — Progress Notes (Signed)
Subjective:    Patient ID: Brad Hill, male    DOB: 12-16-1975, 41 y.o.   MRN: [id number]  HPI  Brad Hill is a 41 yr old male who presents today for ED follow up.  Pt was evaluated in the ED on 06/22/16 following 8 days of flu like symptoms. CXR noted:  Mild bibasilar linear density likely atelectasis, although cannot exclude early infection. He was started on doxycycline. He reports feeling better today than he did on Saturday.  At 4:30 PM he had temp 99.7.  Woke with a headache and took excedrin, took tylenol at 4:40 PM.     Review of Systems    see HPI  Past Medical History:  Diagnosis Date  . Depression   . Pneumonia      Social History   Social History  . Marital status: Married    Spouse name: N/A  . Number of children: N/A  . Years of education: N/A   Occupational History  . Not on file.   Social History Main Topics  . Smoking status: Never Smoker  . Smokeless tobacco: Never Used  . Alcohol use 0.0 oz/week     Comment: occasional "less than once a week"  . Drug use: No  . Sexual activity: Not on file   Other Topics Concern  . 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 UMBILICAL HERNIA REPAIR  1/16  . WISDOM TOOTH EXTRACTION      Family History  Problem Relation Age of Onset  . Hypertension Mother   . Cancer Father 1357    prostate- living, s/p surgery  . Cancer Maternal Grandfather 680    prostate  . Cancer Paternal Grandfather [phone]    prostate     Allergies  Allergen Reactions  . Erythromycin Nausea Only    Current Outpatient Prescriptions on File Prior to Visit  Medication Sig Dispense Refill  . atorvastatin (LIPITOR) 20 MG tablet  TAKE 1 TABLET (20 MG TOTAL) BY MOUTH DAILY 90 tablet 1  . citalopram (CELEXA) 20 MG tablet TAKE 1 TABLET (20 MG TOTAL) BY MOUTH DAILY. 90 tablet 1  . doxycycline (VIBRAMYCIN) 100 MG capsule Take 1 capsule (100 mg total) by mouth 2 (two) times daily. One po bid x 7 days 14 capsule 0  . fenofibrate (TRICOR) 145 MG tablet TAKE 1 TABLET (145 MG TOTAL) BY MOUTH DAILY. 30 tablet 5  . Omega-3 Fatty Acids (FISH OIL) 1000 MG CAPS Take 2 capsules by mouth 2 (two) times daily.     No current facility-administered medications on file prior to visit.     BP 118/75 (BP Location: Right Arm, Cuff Size: Large)   Pulse 78   Temp 98.5 F (36.9 C) (Oral)   Resp 18   Ht 5\' 9"  (1.753 m)   Wt 190 lb 6.4 oz (86.4 kg)   SpO2 95% Comment: room air  BMI 28.12 kg/m    Objective:   Physical Exam  Constitutional: He is oriented to person, place, and time. He appears well-developed and well-nourished. No distress.  HENT:  Head: Normocephalic and atraumatic.  Right Ear:  Tympanic membrane and ear canal normal.  Left Ear: Tympanic membrane and ear canal normal.  Mouth/Throat: No oropharyngeal exudate, posterior oropharyngeal edema or posterior oropharyngeal erythema.  Cardiovascular: Normal rate and regular rhythm.   No murmur heard. Pulmonary/Chest: Effort normal and breath sounds normal. No respiratory distress. He has no wheezes. He has no rales.  Musculoskeletal: He exhibits no edema.  Neurological: He is alert and oriented to person, place, and time.  Skin: Skin is warm and dry.  Psychiatric: He has a normal mood and affect. His behavior is normal. Thought content normal.          Assessment & Plan:  Influenza/Pneumonia-  Normal lung exam, clinically improving. Continue doxycycline. Patient is advised to call if new/worsening symptoms, or if symptoms don't continue to improve.  He is also advised to follow up in 2 weeks for chest xray to ensure resolution of infiltrate.  Pt verbalizes understanding.

## 2016-06-24 NOTE — Telephone Encounter (Signed)
Attempted to reach pt and left message that I have placed him on the [facility] and to call if he is unable to make that appt.

## 2016-06-24 NOTE — Telephone Encounter (Signed)
See 06/24/16 office note.

## 2016-06-24 NOTE — Progress Notes (Signed)
Pre visit review using our clinic review tool, if applicable. No additional management support is needed unless otherwise documented below in the visit note. 

## 2016-06-26 ENCOUNTER — Telehealth: Payer: Self-pay | Admitting: *Deleted

## 2016-06-26 MED ORDER — FENOFIBRATE 145 MG PO TABS: 145.0000 mg | ORAL_TABLET | Freq: Every day | ORAL | 1 refills | Status: DC

## 2016-06-26 NOTE — Telephone Encounter (Signed)
Received request from [facility] for 90 day supply of fenofibrate. Refills sent.

## 2016-06-27 ENCOUNTER — Telehealth: Payer: Self-pay | Admitting: Family

## 2016-06-27 NOTE — Telephone Encounter (Signed)
Could you please contact patient to see how he is feeling.  Is his fever resolved?

## 2016-06-27 NOTE — Telephone Encounter (Signed)
LMOM informing Pt to return call.  

## 2016-07-01 ENCOUNTER — Ambulatory Visit (INDEPENDENT_AMBULATORY_CARE_PROVIDER_SITE_OTHER): Payer: BLUE CROSS/BLUE SHIELD | Admitting: Family

## 2016-07-01 ENCOUNTER — Ambulatory Visit (HOSPITAL_BASED_OUTPATIENT_CLINIC_OR_DEPARTMENT_OTHER)
Admission: RE | Admit: 2016-07-01 | Discharge: 2016-07-01 | Disposition: A | Discharge status: Home / Self Care | Payer: BLUE CROSS/BLUE SHIELD | Source: Ambulatory Visit | Attending: Family | Admitting: Family

## 2016-07-01 ENCOUNTER — Encounter: Payer: Self-pay | Admitting: Family

## 2016-07-01 VITALS — BP 109/70 | HR 71 | Temp 98.5°F | Resp 16 | Ht 69.0 in | Wt 187.4 lb

## 2016-07-01 DIAGNOSIS — J189 Pneumonia, unspecified organism: Secondary | ICD-10-CM

## 2016-07-01 DIAGNOSIS — J9811 Atelectasis: Secondary | ICD-10-CM | POA: Diagnosis not present

## 2016-07-01 DIAGNOSIS — J984 Other disorders of lung: Secondary | ICD-10-CM | POA: Insufficient documentation

## 2016-07-01 MED ORDER — ALBUTEROL SULFATE HFA 108 (90 BASE) MCG/ACT IN AERS: 2.0000 | INHALATION_SPRAY | Freq: Four times a day (QID) | RESPIRATORY_TRACT | 0 refills | Status: DC | PRN

## 2016-07-01 MED ORDER — ALBUTEROL SULFATE (2.5 MG/3ML) 0.083% IN NEBU
2.5000 mg | INHALATION_SOLUTION | Freq: Once | RESPIRATORY_TRACT | Status: AC
  Administered 2016-07-01: 2.5 mg via RESPIRATORY_TRACT

## 2016-07-01 NOTE — Progress Notes (Signed)
Subjective:    Patient ID: Brad Hill, male    DOB: 18-Feb-1976, 41 y.o.   MRN: [id number]  HPI  Brad Hill is a [age] yr old male who presents today to discuss some pleuritic chest pain. He was evaluated in the ED on 06/22/16 with flu-like symptoms and had a CXR which showed possible early PNA. He was placed on doxycycline.  When he was seen in the office on 06/24/16, he noted recent mild temperature, HA with overall improvement in his symptoms.   Today he notes some tightness when he takes a deep breath only. Reports energy is improving and he has had no further fever.      Review of Systems    see HPI  Past Medical History:  Diagnosis Date  . Depression   . Pneumonia      Social History   Social History  . Marital status: Married    Spouse name: N/A  . Number of children: N/A  . Years of education: N/A   Occupational History  . Not on file.   Social History Main Topics  . Smoking status: Never Smoker  . Smokeless tobacco: Never Used  . Alcohol use 0.0 oz/week     Comment: occasional "less than once a week"  . Drug use: No  . Sexual activity: Not on file   Other Topics Concern  . Not on file   Social History Narrative   Has two children ages 2011 Brad Hill(boy) and 699 Brad Hill 2006(daughter), Brad Hill 5/17- he has full legal custody   Works as a Patent examinerpastor   [place]   Enjoys sports, exercises some, spending time with family   Completed masters degree (divinity/theology)          Past Surgical History:  Procedure Laterality Date  . UMBILICAL HERNIA REPAIR  1/16  . WISDOM TOOTH EXTRACTION      Family History  Problem Relation Age of Onset  . Hypertension Mother   . Cancer Father 2357    prostate- living, s/p surgery  . Cancer Maternal Grandfather 6780    prostate  . Cancer Paternal Grandfather 4180    prostate     Allergies  Allergen Reactions  . Erythromycin Nausea Only    Current Outpatient Prescriptions on File Prior to Visit  Medication Sig Dispense  Refill  . atorvastatin (LIPITOR) 20 MG tablet TAKE 1 TABLET (20 MG TOTAL) BY MOUTH DAILY 90 tablet 1  . citalopram (CELEXA) 20 MG tablet TAKE 1 TABLET (20 MG TOTAL) BY MOUTH DAILY. 90 tablet 1  . doxycycline (VIBRAMYCIN) 100 MG capsule Take 1 capsule (100 mg total) by mouth 2 (two) times daily. One po bid x 7 days 14 capsule 0  . fenofibrate (TRICOR) 145 MG tablet Take 1 tablet (145 mg total) by mouth daily. 90 tablet 1  . Omega-3 Fatty Acids (FISH OIL) 1000 MG CAPS Take 2 capsules by mouth 2 (two) times daily.     No current facility-administered medications on file prior to visit.     BP 109/70 (BP Location: Right Arm, Patient Position: Sitting, Cuff Size: Large)   Pulse 71   Temp 98.5 F (36.9 C) (Oral)   Resp 16   Ht 5\' 9"  (1.753 m)   Wt 187 lb 6.4 oz (85 kg)   SpO2 97%   BMI 27.67 kg/m    Objective:   Physical Exam  Constitutional: He is oriented to person, place, and time. He appears well-developed and well-nourished. No distress.  HENT:  Head: Normocephalic and atraumatic.  Right Ear: Tympanic membrane and ear canal normal.  Left Ear: Tympanic membrane and ear canal normal.  Mouth/Throat: No oropharyngeal exudate, posterior oropharyngeal edema or posterior oropharyngeal erythema.  Eyes: Pupils are equal, round, and reactive to light.  Cardiovascular: Normal rate and regular rhythm.   No murmur heard. Pulmonary/Chest: Effort normal and breath sounds normal. No respiratory distress. He has no wheezes. He has no rales.  Musculoskeletal: He exhibits no edema.  Lymphadenopathy:    He has no cervical adenopathy.  Neurological: He is alert and oriented to person, place, and time.  Skin: Skin is warm and dry.  Psychiatric: He has a normal mood and affect. His behavior is normal. Thought content normal.          Assessment & Plan:  CAP- clinically improving overall, now just having some mild pleuritic discomfort.  Will obtain follow up chest x-ray. Albuterol neb given in  the office today. Will give trial of albuterol for the next few days.  Will obtain follow up chest xray. Call if new/worsening symptoms or if symptoms do not continue to improve.  Pt verbalize understanding.

## 2016-07-01 NOTE — Telephone Encounter (Signed)
Advise please.

## 2016-07-01 NOTE — Patient Instructions (Signed)
Try using albuterol 2 puffs every 6 hours as needed for the next few days.  Complete your chest x ray on the first floor. Call if new/worsening symptoms or if not improved in 3 days.

## 2016-07-01 NOTE — Telephone Encounter (Signed)
Called left detailed message of provider question and to please return our call with information.

## 2016-07-01 NOTE — Telephone Encounter (Signed)
Lets bring him back in for follow up today please.

## 2016-07-01 NOTE — Progress Notes (Signed)
Pre visit review using our clinic review tool, if applicable. No additional management support is needed unless otherwise documented below in the visit note. 

## 2016-07-01 NOTE — Telephone Encounter (Signed)
Spoke to patient. Fever is resolved/still having breathing pressure/tightness He has only one doxycycline left

## 2016-07-01 NOTE — Addendum Note (Signed)
Addended by: Scharlene GlossEWING, Geisha Abernathy B on: 07/01/2016 03:57 PM   Modules accepted: Orders

## 2016-07-01 NOTE — Telephone Encounter (Signed)
Patient informed of instructions and agreed to scheduled appt today at 2:30.

## 2016-07-02 ENCOUNTER — Telehealth: Payer: Self-pay

## 2016-07-02 NOTE — Telephone Encounter (Signed)
-----   Message from Sandford CrazeMelissa O'Sullivan, NP sent at 07/01/2016  6:52 PM EST ----- cxr negative for pneumonia. Looks good.

## 2016-07-02 NOTE — Telephone Encounter (Signed)
Spoke to pt. about results pt voiced understanding

## 2016-07-08 ENCOUNTER — Telehealth: Payer: Self-pay | Admitting: Family

## 2016-07-08 NOTE — Telephone Encounter (Signed)
Patient Name: Brad HarrisDARIN Hill DOB: [birth year] Initial Comment Caller says, was seen recently for both Flu and Pneumonia. Now has a deep cough which makes him gag, makes eyes water and double over. Nurse Assessment Nurse: Yetta BarreJones, RN, Miranda Date/Time (Eastern Time): 07/08/2016 12:41:20 PM Confirm and document reason for call. If symptomatic, describe symptoms. ---Caller states he was diagnosed with Pneumonia 2 weeks ago. He was treated with antibiotics and has completed those. About 8 days ago, the cough began to get worse. He states he is coughing to the point he gags. He was prescribed Albuterol inhaler for this illness and has been using it 1-2 times per day. Denies fever or SOB. Does the patient have any new or worsening symptoms? ---Yes Will a triage be completed? ---Yes Related visit to physician within the last 2 weeks? ---Yes Does the PT have any chronic conditions? (i.e. diabetes, asthma, etc.) ---Yes List chronic conditions. ---High Cholesterol Is this a behavioral health or substance abuse call? ---No Guidelines Guideline Title Affirmed Question Affirmed Notes Cough - Chronic SEVERE coughing spells (e.g., whooping sound after coughing, vomiting after coughing) Final Disposition User See Physician within 24 Hours Yetta BarreJones, RN, Miranda Comments No appt available with PCP. Office closed early for weather. Appt scheduled for tomorrow morning at 10:30am with Dr. Drue NovelPaz. I called and spoke with Angelique Blonderenise and they are planning on opening at 10am tomorrow but will notify pt if they have to cancel. Referrals REFERRED TO PCP OFFICE Disagree/Comply: Comply

## 2016-07-09 ENCOUNTER — Ambulatory Visit: Payer: Self-pay | Admitting: Internal Medicine

## 2016-09-25 ENCOUNTER — Other Ambulatory Visit: Payer: Self-pay | Admitting: *Deleted

## 2016-09-25 MED ORDER — ATORVASTATIN CALCIUM 20 MG PO TABS: ORAL_TABLET | ORAL | 0 refills | Status: DC

## 2016-09-25 NOTE — Progress Notes (Signed)
[facility]atin 20 mg tab Last filled by MD on 01/19/16, #90x1 Last AEX - 12/13/15 [Acutes only post CPE] Requested drug refills are authorized, however, the patient needs further evaluation and/or laboratory testing before further refills are given. Ask him to make an appointment for this/SLS 05/30

## 2016-10-14 ENCOUNTER — Other Ambulatory Visit: Payer: Self-pay | Admitting: *Deleted

## 2016-10-14 MED ORDER — CITALOPRAM HYDROBROMIDE 20 MG PO TABS: 20.0000 mg | ORAL_TABLET | Freq: Every day | ORAL | 0 refills | Status: DC

## 2016-10-14 NOTE — Progress Notes (Unsigned)
[facility]ed [facility] Citalopram 20 mg tab Last filled by MD on 04/05/16, #90x1 Last AEX - 12/13/15 [07/01/16 last OV] [facility]/SLS  Please call patient and have him schedule F/U appointment and have him to fast for possible labs; OR he can schedule CPE after last date of 12/13/15, but that must be in August if he does so [cannot put off until later]; Please and Thank You/SLS 06/18

## 2017-02-01 ENCOUNTER — Other Ambulatory Visit: Payer: Self-pay | Admitting: Family

## 2017-04-07 ENCOUNTER — Telehealth: Payer: BLUE CROSS/BLUE SHIELD | Admitting: Family

## 2017-04-07 DIAGNOSIS — J028 Acute pharyngitis due to other specified organisms: Secondary | ICD-10-CM

## 2017-04-07 DIAGNOSIS — B9689 Other specified bacterial agents as the cause of diseases classified elsewhere: Secondary | ICD-10-CM

## 2017-04-07 MED ORDER — BENZONATATE 100 MG PO CAPS: 100.0000 mg | ORAL_CAPSULE | Freq: Three times a day (TID) | ORAL | 0 refills | Status: DC | PRN

## 2017-04-07 MED ORDER — PREDNISONE 5 MG PO TABS: 5.0000 mg | ORAL_TABLET | ORAL | 0 refills | Status: DC

## 2017-04-07 MED ORDER — DOXYCYCLINE HYCLATE 100 MG PO TABS: 100.0000 mg | ORAL_TABLET | Freq: Two times a day (BID) | ORAL | 0 refills | Status: DC

## 2017-04-07 NOTE — Progress Notes (Signed)
Thank you for the details you included in the comment boxes. Those details are very helpful in determining the best course of treatment for you and help us to provide the best care.  We are sorry that you are not feeling well.  Here is how we plan to help!  Based on your presentation I believe you most likely have A cough due to bacteria.  When patients have a fever and a productive cough with a change in color or increased sputum production, we are concerned about bacterial bronchitis.  If left untreated it can progress to pneumonia.  If your symptoms do not improve with your treatment plan it is important that you contact your provider.   I have prescribed Doxycycline 100 mg twice a day for 7 days     In addition you may use A non-prescription cough medication called Mucinex DM: take 2 tablets every 12 hours. and A prescription cough medication called Tessalon Perles 100mg. You may take 1-2 capsules every 8 hours as needed for your cough.  Sterapred 5 mg dosepak  From your responses in the eVisit questionnaire you describe inflammation in the upper respiratory tract which is causing a significant cough.  This is commonly called Bronchitis and has four common causes:    Allergies  Viral Infections  Acid Reflux  Bacterial Infection Allergies, viruses and acid reflux are treated by controlling symptoms or eliminating the cause. An example might be a cough caused by taking certain blood pressure medications. You stop the cough by changing the medication. Another example might be a cough caused by acid reflux. Controlling the reflux helps control the cough.  USE OF BRONCHODILATOR ("RESCUE") INHALERS: There is a risk from using your bronchodilator too frequently.  The risk is that over-reliance on a medication which only relaxes the muscles surrounding the breathing tubes can reduce the effectiveness of medications prescribed to reduce swelling and congestion of the tubes themselves.  Although you  feel brief relief from the bronchodilator inhaler, your asthma may actually be worsening with the tubes becoming more swollen and filled with mucus.  This can delay other crucial treatments, such as oral steroid medications. If you need to use a bronchodilator inhaler daily, several times per day, you should discuss this with your provider.  There are probably better treatments that could be used to keep your asthma under control.     HOME CARE . Only take medications as instructed by your medical team. . Complete the entire course of an antibiotic. . Drink plenty of fluids and get plenty of rest. . Avoid close contacts especially the very young and the elderly . Cover your mouth if you cough or cough into your sleeve. . Always remember to wash your hands . A steam or ultrasonic humidifier can help congestion.   GET HELP RIGHT AWAY IF: . You develop worsening fever. . You become short of breath . You cough up blood. . Your symptoms persist after you have completed your treatment plan MAKE SURE YOU   Understand these instructions.  Will watch your condition.  Will get help right away if you are not doing well or get worse.  Your e-visit answers were reviewed by a board certified advanced clinical practitioner to complete your personal care plan.  Depending on the condition, your plan could have included both over the counter or prescription medications. If there is a problem please reply  once you have received a response from your provider. Your safety is important to us.    If you have drug allergies check your prescription carefully.    You can use MyChart to ask questions about today's visit, request a non-urgent call back, or ask for a work or school excuse for 24 hours related to this e-Visit. If it has been greater than 24 hours you will need to follow up with your provider, or enter a new e-Visit to address those concerns. You will get an e-mail in the next two days asking about  your experience.  I hope that your e-visit has been valuable and will speed your recovery. Thank you for using e-visits.   

## 2017-04-30 ENCOUNTER — Encounter: Payer: Self-pay | Admitting: Family

## 2017-04-30 ENCOUNTER — Ambulatory Visit: Payer: BLUE CROSS/BLUE SHIELD | Admitting: Family

## 2017-04-30 VITALS — BP 130/83 | HR 73 | Temp 98.0°F | Resp 16 | Ht 69.0 in | Wt 193.0 lb

## 2017-04-30 DIAGNOSIS — R41840 Attention and concentration deficit: Secondary | ICD-10-CM | POA: Diagnosis not present

## 2017-04-30 DIAGNOSIS — R059 Cough, unspecified: Secondary | ICD-10-CM

## 2017-04-30 DIAGNOSIS — Z23 Encounter for immunization: Secondary | ICD-10-CM

## 2017-04-30 DIAGNOSIS — R05 Cough: Secondary | ICD-10-CM | POA: Diagnosis not present

## 2017-04-30 MED ORDER — OMEPRAZOLE 40 MG PO CPDR: 40.0000 mg | DELAYED_RELEASE_CAPSULE | Freq: Every day | ORAL | 3 refills | Status: DC

## 2017-04-30 NOTE — Progress Notes (Signed)
Subjective:    Patient ID: Brad Hill, male    DOB: [birth year], 42 y.o.   MRN: [id number]  HPI  Patient is a 42 yr old male who presents today with chief complaint of cough.  He reports the cough has been occurring for approximately 1 year.  He reports that cough is generally present after meals.  Denies dysphagia.  He does report some intermittent voice hoarseness.  Attention Deficit-he notes that he is always had some issues with concentration but he has been able to manage it and excel in school and at work.  However more recently he notes that he has been having a more difficult time paying attention.  He is concerned that he may have some ADHD.  He has done some online screenings which have been suggestive of ADHD. Review of Systems   See HPI  Past Medical History:  Diagnosis Date  . Depression   . Pneumonia      Social History   Socioeconomic History  . Marital status: Married    Spouse name: Not on file  . Number of children: Not on file  . Years of education: Not on file  . Highest education level: Not on file  Social Needs  . Financial resource strain: Not on file  . Food insecurity - worry: Not on file  . Food insecurity - inability: Not on file  . Transportation needs - medical: Not on file  . Transportation needs - non-medical: Not on file  Occupational History  . Not on file  Tobacco Use  . Smoking status: Never Smoker  . Smokeless tobacco: Never Used  Substance and Sexual Activity  . Alcohol use: Yes    Alcohol/week: 0.0 oz    Comment: occasional "less than once a week"  . Drug use: No  . Sexual activity: Not on file  Other Topics Concern  . 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 UMBILICAL HERNIA REPAIR  1/16  . WISDOM TOOTH EXTRACTION      Family History  Problem Relation Age of Onset  . Hypertension Mother   . Cancer Father 70       prostate- living, s/p surgery  . Cancer Maternal Grandfather 78       prostate  . Cancer Paternal Grandfather 29       prostate     Allergies  Allergen Reactions  . Erythromycin Nausea Only    Current Outpatient Medications on File Prior to Visit  Medication Sig Dispense Refill  . atorvastatin (LIPITOR) 20 MG tablet TAKE 1 TABLET BY MOUTH EVERY DAY NEED APPOINTMENT FOR FURTHER REFILLS 10 tablet 0  . fenofibrate (TRICOR) 145 MG tablet Take 1 tablet (145 mg total) by mouth daily. 90 tablet 1  . Omega-3 Fatty Acids (FISH OIL) 1000 MG CAPS Take 2 capsules by mouth 2 (two) times daily.     No current facility-administered medications on file prior to visit.     BP 130/83 (BP Location: Left Arm, Patient Position: Sitting, Cuff Size: Small)   Pulse 73   Temp 98 F (36.7 C)   Resp 16   Ht 5'  9" (1.753 m)   Wt 193 lb (87.5 kg)   SpO2 98%   BMI 28.50 kg/m        Objective:   Physical Exam  Constitutional: He is oriented to person, place, and time. He appears well-developed and well-nourished. No distress.  HENT:  Head: Normocephalic and atraumatic.  Cardiovascular: Normal rate and regular rhythm.  No murmur heard. Pulmonary/Chest: Effort normal and breath sounds normal. No respiratory distress. He has no wheezes. He has no rales.  Musculoskeletal: He exhibits no edema.  Neurological: He is alert and oriented to person, place, and time.  Skin: Skin is warm and dry.  Psychiatric: He has a normal mood and affect. His behavior is normal. Thought content normal.          Assessment & Plan:  Cough-I suspect that this is GERD related since it is postprandial in nature and he also has some associated intermittent voice hoarseness.  I have advised a trial of omeprazole.   I have asked him to send me a note or call me in 3 weeks to let me know how his cough is doing.  If his cough is improved we will consider decreasing to 20 mg.  If cough does not improve may need additional workup such as chest x-ray and/or pulmonary referral.  Attention deficit-we will refer him for formal ADHD testing with behavioral health.

## 2017-06-16 ENCOUNTER — Ambulatory Visit: Payer: BLUE CROSS/BLUE SHIELD | Admitting: Family Medicine

## 2017-06-16 ENCOUNTER — Encounter: Payer: Self-pay | Admitting: Family Medicine

## 2017-06-16 VITALS — BP 102/68 | HR 75 | Temp 98.4°F | Ht 69.0 in | Wt 194.1 lb

## 2017-06-16 DIAGNOSIS — J029 Acute pharyngitis, unspecified: Secondary | ICD-10-CM | POA: Diagnosis not present

## 2017-06-16 NOTE — Patient Instructions (Addendum)
Ibuprofen 400-600 mg (2-3 over the counter strength tabs) every 6 hours as needed for pain.  OK to take Tylenol 1000 mg (2 extra strength tabs) or 975 mg (3 regular strength tabs) every 6 hours as needed.  Continue to push fluids, practice good hand hygiene, and cover your mouth if you cough.  If you start having fevers, shaking or shortness of breath, seek immediate care.  We will let you know in 1-2 days the results of your culture. If positive, we will call in an antibiotic. If neg, this is likely viral and requires supportive.

## 2017-06-16 NOTE — Addendum Note (Signed)
Addended by: Scharlene GlossEWING, ROBIN B on: 06/16/2017 10:05 AM   Modules accepted: Orders

## 2017-06-16 NOTE — Progress Notes (Signed)
Chief Complaint  Patient presents with  . Sore Throat    Brad Hill here for URI complaints.  Duration: 1 day  Associated symptoms: sinus congestion, ear pain and sore throat Denies: fever, sinus pain, rhinorrhea, itchy watery eyes, ear drainage, shortness of breath, myalgia and cough Treatment to date: Dayquil Sick contacts: Yes  ROS:  Const: Denies fevers HEENT: As noted in HPI Lungs: No SOB  Past Medical History:  Diagnosis Date  . Depression   . Pneumonia    Family History  Problem Relation Age of Onset  . Hypertension Mother   . Cancer Father [id number]       prostate- living, s/p surgery  . Cancer Maternal Grandfather [phone]       prostate  . Cancer Paternal Grandfather [phone]       prostate     BP 102/68 (BP Location: Left Arm, Patient Position: Sitting, Cuff Size: Normal)   Pulse 75   Temp 98.4 F (36.9 C) (Oral)   Ht 5\' 9"  (1.753 m)   Wt 194 lb 2 oz (88.1 kg)   SpO2 95%   BMI 28.67 kg/m  General: Awake, alert, appears stated age HEENT: AT, [place], ears patent b/l and TM's neg, nares patent w/o discharge, pharynx erythematous and without exudates, MMM Neck: No masses or asymmetry Heart: RRR Lungs: CTAB, no accessory muscle use Psych: Age appropriate judgment and insight, normal mood and affect  Sore throat  Rapid strep neg, will culture throat given sick contacts. Ibuprofen, Tylenol, continue to push fluids, practice good hand hygiene. F/u prn. If starting to experience fevers, shaking, or shortness of breath, seek immediate care. Pt voiced understanding and agreement to the plan.  Jilda Rocheicholas Paul MontezumaWendling, DO 06/16/17 9:55 AM

## 2017-06-16 NOTE — Progress Notes (Signed)
Pre visit review using our clinic review tool, if applicable. No additional management support is needed unless otherwise documented below in the visit note. 

## 2017-06-18 ENCOUNTER — Other Ambulatory Visit: Payer: Self-pay | Admitting: Family Medicine

## 2017-06-18 LAB — CULTURE, GROUP A STREP
MICRO NUMBER: 90212192
SPECIMEN QUALITY: ADEQUATE

## 2017-06-18 MED ORDER — AMOXICILLIN 500 MG PO CAPS: 1000.0000 mg | ORAL_CAPSULE | Freq: Every day | ORAL | 0 refills | Status: AC

## 2017-06-25 ENCOUNTER — Telehealth: Payer: Self-pay | Admitting: *Deleted

## 2017-06-25 MED ORDER — FENOFIBRATE 145 MG PO TABS: 145.0000 mg | ORAL_TABLET | Freq: Every day | ORAL | 0 refills | Status: DC

## 2017-06-25 NOTE — Telephone Encounter (Signed)
Okay to send refill, let us bring him in for a physical in the next month or so please.

## 2017-06-25 NOTE — Telephone Encounter (Signed)
Called pt and left voicemail asking pt to call us to schedule appt for cpe.

## 2017-06-25 NOTE — Telephone Encounter (Signed)
Received [facility] from [facility] requesting fenofibrate refill. 90 day supply sent. Pt has been seen twice this year for acute visits and has no future appts scheduled. Last lipid panel 08/2015.  Please advise future appointment?

## 2017-06-25 NOTE — Telephone Encounter (Signed)
Brad Hill-- can you call pt to schedule cpe with Melissa as advised below?

## 2017-06-30 DIAGNOSIS — L820 Inflamed seborrheic keratosis: Secondary | ICD-10-CM | POA: Diagnosis not present

## 2017-06-30 DIAGNOSIS — D485 Neoplasm of uncertain behavior of skin: Secondary | ICD-10-CM | POA: Diagnosis not present

## 2017-07-18 ENCOUNTER — Ambulatory Visit (INDEPENDENT_AMBULATORY_CARE_PROVIDER_SITE_OTHER): Payer: BLUE CROSS/BLUE SHIELD | Admitting: Family

## 2017-07-18 ENCOUNTER — Encounter: Payer: Self-pay | Admitting: Family

## 2017-07-18 VITALS — BP 125/81 | HR 74 | Temp 98.4°F | Resp 16 | Ht 69.0 in | Wt 195.0 lb

## 2017-07-18 DIAGNOSIS — F329 Major depressive disorder, single episode, unspecified: Secondary | ICD-10-CM | POA: Diagnosis not present

## 2017-07-18 DIAGNOSIS — Z0000 Encounter for general adult medical examination without abnormal findings: Secondary | ICD-10-CM

## 2017-07-18 DIAGNOSIS — F32A Depression, unspecified: Secondary | ICD-10-CM

## 2017-07-18 DIAGNOSIS — Z125 Encounter for screening for malignant neoplasm of prostate: Secondary | ICD-10-CM

## 2017-07-18 LAB — HEPATIC FUNCTION PANEL
ALBUMIN: 4.5 g/dL (ref 3.5–5.2)
ALT: 29 U/L (ref 0–53)
AST: 23 U/L (ref 0–37)
Alkaline Phosphatase: 80 U/L (ref 39–117)
BILIRUBIN TOTAL: 0.4 mg/dL (ref 0.2–1.2)
Bilirubin, Direct: 0.1 mg/dL (ref 0.0–0.3)
Total Protein: 7.3 g/dL (ref 6.0–8.3)

## 2017-07-18 LAB — CBC WITH DIFFERENTIAL/PLATELET
Basophils Absolute: 0 10*3/uL (ref 0.0–0.1)
Basophils Relative: 0.5 % (ref 0.0–3.0)
EOS PCT: 2.1 % (ref 0.0–5.0)
Eosinophils Absolute: 0.2 10*3/uL (ref 0.0–0.7)
HCT: 43.3 % (ref 39.0–52.0)
Hemoglobin: 15 g/dL (ref 13.0–17.0)
LYMPHS ABS: 3.5 10*3/uL (ref 0.7–4.0)
Lymphocytes Relative: 40.5 % (ref 12.0–46.0)
MCHC: 34.6 g/dL (ref 30.0–36.0)
MCV: 86.3 fl (ref 78.0–100.0)
MONO ABS: 0.8 10*3/uL (ref 0.1–1.0)
MONOS PCT: 8.9 % (ref 3.0–12.0)
NEUTROS ABS: 4.1 10*3/uL (ref 1.4–7.7)
NEUTROS PCT: 48 % (ref 43.0–77.0)
PLATELETS: 335 10*3/uL (ref 150.0–400.0)
RBC: 5.02 Mil/uL (ref 4.22–5.81)
RDW: 13.1 % (ref 11.5–15.5)
WBC: 8.6 10*3/uL (ref 4.0–10.5)

## 2017-07-18 LAB — TSH: TSH: 1.18 u[IU]/mL (ref 0.35–4.50)

## 2017-07-18 LAB — LDL CHOLESTEROL, DIRECT: Direct LDL: 103 mg/dL

## 2017-07-18 LAB — LIPID PANEL
CHOL/HDL RATIO: 6
CHOLESTEROL: 202 mg/dL — AB (ref 0–200)
HDL: 34.8 mg/dL — AB (ref >39.00)

## 2017-07-18 LAB — URINALYSIS, ROUTINE W REFLEX MICROSCOPIC
Bilirubin Urine: NEGATIVE
HGB URINE DIPSTICK: NEGATIVE
Ketones, ur: NEGATIVE
Leukocytes, UA: NEGATIVE
Nitrite: NEGATIVE
PH: 7 (ref 5.0–8.0)
TOTAL PROTEIN, URINE-UPE24: NEGATIVE
Urine Glucose: NEGATIVE
Urobilinogen, UA: 0.2 (ref 0.0–1.0)

## 2017-07-18 LAB — BASIC METABOLIC PANEL
BUN: 13 mg/dL (ref 6–23)
CALCIUM: 9.8 mg/dL (ref 8.4–10.5)
CO2: 29 meq/L (ref 19–32)
Chloride: 103 mEq/L (ref 96–112)
Creatinine, Ser: 1 mg/dL (ref 0.40–1.50)
GFR: 86.86 mL/min (ref >60.00)
GLUCOSE: 76 mg/dL (ref 70–99)
Potassium: 4.6 mEq/L (ref 3.5–5.1)
Sodium: 139 mEq/L (ref 135–145)

## 2017-07-18 LAB — PSA: PSA: 0.97 ng/mL (ref 0.10–4.00)

## 2017-07-18 MED ORDER — DULOXETINE HCL 30 MG PO CPEP: ORAL_CAPSULE | ORAL | 0 refills | Status: DC

## 2017-07-18 MED ORDER — CITALOPRAM HYDROBROMIDE 20 MG PO TABS: ORAL_TABLET | ORAL | 3 refills | Status: DC

## 2017-07-18 NOTE — Progress Notes (Signed)
Subjective:    Patient ID: Brad Hill, male    DOB: November 19, 1975, 42 y.o.   MRN: [id number]  HPI  Brad Hill is a 42 yr old male who presents today for cpx.  Immunizations:last tetanus was 2016, flu shot up to date Diet: diet is "so so" needs improvement Exercise: very little, typically walks, plans to restart  Vision: due Dental: up to date Wt Readings from Last 3 Encounters:  07/18/17 195 lb (88.5 kg)  06/16/17 194 lb 2 oz (88.1 kg)  04/30/17 193 lb (87.5 kg)   Dad had prostate CA in his mid 1350's. Requests PSA  Depression- continues citalopram, feels like his depression symptoms have worsened some. Feels a lack of emotion on citalopram.   Review of Systems  Constitutional: Positive for fatigue. Negative for unexpected weight change.  HENT: Negative for hearing loss and rhinorrhea.   Eyes: Negative for visual disturbance.  Respiratory: Negative for cough.   Cardiovascular: Negative for leg swelling.  Gastrointestinal: Negative for blood in stool, constipation and diarrhea.  Genitourinary: Negative for dysuria and frequency.       Notes normal libido  Musculoskeletal: Negative for arthralgias and myalgias.  Skin: Negative for rash.  Neurological: Negative for headaches.  Hematological: Negative for adenopathy.  Psychiatric/Behavioral:       Reports some concern about depression, takes citalopram, has a lot life changes/stress.     Past Medical History:  Diagnosis Date  . Depression   . Pneumonia      Social History   Socioeconomic History  . Marital status: Married    Spouse name: Not on file  . Number of children: Not on file  . Years of education: Not on file  . Highest education level: Not on file  Occupational History  . Not on file  Social Needs  . Financial resource strain: Not on file  . Food insecurity:    Worry: Not on file    Inability: Not on file  . Transportation needs:    Medical: Not on file    Non-medical: Not on file  Tobacco Use  .  Smoking status: Never Smoker  . Smokeless tobacco: Never Used  Substance and Sexual Activity  . Alcohol use: Yes    Alcohol/week: 0.0 oz    Comment: occasional "less than once a week"  . Drug use: No  . Sexual activity: Not on file  Lifestyle  . Physical activity:    Days per week: Not on file    Minutes per session: Not on file  . Stress: Not on file  Relationships  . Social connections:    Talks on phone: Not on file    Gets together: Not on file    Attends religious service: Not on file    Active member of club or organization: Not on file    Attends meetings of clubs or organizations: Not on file    Relationship status: Not on file  . Intimate partner violence:    Fear of current or ex partner: Not on file    Emotionally abused: Not on file    Physically abused: Not on file    Forced sexual activity: Not on file  Other Topics Concern  . Not on file  Social History Narrative   Has two children ages 2011 [name](boy) and 319 sarah 2006(daughter), Alycia RossettiRyan 5/17- he has full legal custody   Works as a Patent examinerpastor   [place]   Enjoys sports, exercises some, spending time with  family   Completed masters degree (divinity/theology)       Past Surgical History:  Procedure Laterality Date  . UMBILICAL HERNIA REPAIR  1/16  . WISDOM TOOTH EXTRACTION      Family History  Problem Relation Age of Onset  . Hypertension Mother   . Cancer Father 34       prostate- living, s/p surgery  . Cancer Maternal Grandfather 60       prostate  . Cancer Paternal Grandfather 49       prostate     Allergies  Allergen Reactions  . Erythromycin Nausea Only    Current Outpatient Medications on File Prior to Visit  Medication Sig Dispense Refill  . atorvastatin (LIPITOR) 20 MG tablet TAKE 1 TABLET BY MOUTH EVERY DAY NEED APPOINTMENT FOR FURTHER REFILLS 10 tablet 0  . fenofibrate (TRICOR) 145 MG tablet Take 1 tablet (145 mg total) by mouth daily. 90 tablet 0  . Omega-3 Fatty Acids (FISH  OIL) 1000 MG CAPS Take 2 capsules by mouth 2 (two) times daily.    Marland Kitchen omeprazole (PRILOSEC) 40 MG capsule Take 1 capsule (40 mg total) by mouth daily. (Patient not taking: Reported on 07/18/2017) 30 capsule 3   No current facility-administered medications on file prior to visit.     BP 125/81 (BP Location: Right Arm, Patient Position: Sitting, Cuff Size: Small)   Pulse 74   Temp 98.4 F (36.9 C) (Oral)   Resp 16   Ht 5\' 9"  (1.753 m)   Wt 195 lb (88.5 kg)   SpO2 99%   BMI 28.80 kg/m       Objective:   Physical Exam  Physical Exam  Constitutional: He is oriented to person, place, and time. He appears well-developed and well-nourished. No distress.  HENT:  Head: Normocephalic and atraumatic.  Right Ear: Tympanic membrane and ear canal normal.  Left Ear: Tympanic membrane and ear canal normal.  Mouth/Throat: Oropharynx is clear and moist.  Eyes: Pupils are equal, round, and reactive to light. No scleral icterus.  Neck: Normal range of motion. No thyromegaly present.  Cardiovascular: Normal rate and regular rhythm.   No murmur heard. Pulmonary/Chest: Effort normal and breath sounds normal. No respiratory distress. He has no wheezes. He has no rales. He exhibits no tenderness.  Abdominal: Soft. Bowel sounds are normal. He exhibits no distension and no mass. There is no tenderness. There is no rebound and no guarding.  Musculoskeletal: He exhibits no edema.  Lymphadenopathy:    He has no cervical adenopathy.  Neurological: He is alert and oriented to person, place, and time. He has normal patellar reflexes. He exhibits normal muscle tone. Coordination normal.  Skin: Skin is warm and dry.  Psychiatric: He has a normal mood and affect. His behavior is normal. Judgment and thought content normal.           Assessment & Plan:   Preventative care- discussed healthy diet and exercise. Will obtain routine lab work. He also requests PSA and testosterone levels. EKG tracing is  personally reviewed.  EKG notes NSR.  No acute changes.    Depression- uncontrolled. We discussed possibility of counseling and I have given him a number for a counselor to schedule. Also advised pt as follows:  Start cymbalta 30mg  once daily for 1 week, then increase to 2 tabs once daily on week two. Decrease citalopram to 10mg  once daily for 1 week then every other day for 1 week then stop.  Follow up in 1  month.       Assessment & Plan:

## 2017-07-18 NOTE — Patient Instructions (Addendum)
Please schedule a routine eye exam. Continue to work on healthy diet and regular exercise. Complete lab work prior to leaving. Start cymbalta 30mg  once daily for 1 week, then increase to 2 tabs once daily on week two. Decrease citalopram to 10mg  once daily for 1 week then every other day for 1 week then stop.

## 2017-07-21 ENCOUNTER — Telehealth: Payer: Self-pay | Admitting: Family

## 2017-07-21 ENCOUNTER — Other Ambulatory Visit: Payer: Self-pay | Admitting: Family

## 2017-07-21 NOTE — Telephone Encounter (Signed)
See mychart.  

## 2017-07-22 MED ORDER — ATORVASTATIN CALCIUM 20 MG PO TABS: 20.0000 mg | ORAL_TABLET | Freq: Every day | ORAL | 5 refills | Status: DC

## 2017-07-22 MED ORDER — ATORVASTATIN CALCIUM 20 MG PO TABS
20.0000 mg | ORAL_TABLET | Freq: Every day | ORAL | 5 refills | Status: DC
Start: 2017-07-22 — End: 2017-10-04

## 2017-07-22 NOTE — Addendum Note (Signed)
Addended by: Sandford Craze'SULLIVAN, Leydy Worthey on: 07/22/2017 01:37 PM   Modules accepted: Orders

## 2017-07-22 NOTE — Telephone Encounter (Signed)
Received transmission failure from [employer] for the lipitor RX.  Re-sent prescription.

## 2017-07-22 NOTE — Addendum Note (Signed)
Addended by: Mervin KungFERGERSON, Lucy Boardman A on: 07/22/2017 05:10 PM   Modules accepted: Orders

## 2017-07-28 ENCOUNTER — Ambulatory Visit: Payer: Self-pay | Admitting: *Deleted

## 2017-07-28 NOTE — Telephone Encounter (Addendum)
Pt   advised  Clear liquids   If  Vomiting  Reoccurs  Or  Gets  Worse  He   Was  Advised  To  Go to er/ ucc tonight     Reason for Disposition . SEVERE diarrhea (e.g., 7 or more times / day more than normal)  Answer Assessment - Initial Assessment Questions 1. DIARRHEA SEVERITY: "How bad is the diarrhea?" "How many extra stools have you had in the past 24 hours than normal?"    - MILD: Few loose or mushy BMs; increase of 1-3 stools over normal daily number of stools; mild increase in ostomy output.   - MODERATE: Increase of 4-6 stools daily over normal; moderate increase in ostomy output.   - SEVERE (or Worst Possible): Increase of 7 or more stools daily over normal; moderate increase in ostomy output; incontinence.      8   2. ONSET: "When did the diarrhea begin?"       8  HOURS    3. BM CONSISTENCY: "How loose or watery is the diarrhea?"        WATERY   4. VOMITING: "Are you also vomiting?" If so, ask: "How many times in the past 24 hours?"        4 TIMES    5. ABDOMINAL PAIN: "Are you having any abdominal pain?" If yes: "What does it feel like?" (e.g., crampy, dull, intermittent, constant)        Unsettled     6. ABDOMINAL PAIN SEVERITY: If present, ask: "How bad is the pain?"  (e.g., Scale 1-10; mild, moderate, or severe)    - MILD (1-3): doesn't interfere with normal activities, abdomen soft and not tender to touch     - MODERATE (4-7): interferes with normal activities or awakens from sleep, tender to touch     - SEVERE (8-10): excruciating pain, doubled over, unable to do any normal activities       Mild   7. ORAL INTAKE: If vomiting, "Have you been able to drink liquids?" "How much fluids have you had in the past 24 hours?"      Has  Not tried  Any  Liquids  Yet   8. HYDRATION: "Any signs of dehydration?" (e.g., dry mouth [not just dry lips], too weak to stand, dizziness, new weight loss) "When did you last urinate?"     Mouth   Is  Dry  decreased  Urinary output  Feels  Weak   Just   A  Few   Drops  One  Year  Ago   9. EXPOSURE: "Have you traveled to a foreign country recently?" "Have you been exposed to anyone with diarrhea?" "Could you have eaten any food that was spoiled?"      No    -  Nobody  Karolee Ohslse has  Gi  Symptoms      10. OTHER SYMPTOMS: "Do you have any other symptoms?" (e.g., fever, blood in stool)         No   11. PREGNANCY: "Is there any chance you are pregnant?" "When was your last menstrual period?"       n/a  Protocols used: DIARRHEA-A-AH

## 2017-07-28 NOTE — Telephone Encounter (Signed)
Pt  Wishes  To  Try  Clear  Liquids  First   To see if that  Will help . Pt  Advised  To  Avoid  Dairy  Products  If vomiting or  abd  Pain  Occurs  Go to er or ucc. Pt advised   To  Wash hands  And  Use  Caution

## 2017-08-13 ENCOUNTER — Encounter: Payer: Self-pay | Admitting: *Deleted

## 2017-08-18 ENCOUNTER — Ambulatory Visit: Payer: BLUE CROSS/BLUE SHIELD | Admitting: Family

## 2017-08-18 DIAGNOSIS — Z0289 Encounter for other administrative examinations: Secondary | ICD-10-CM

## 2017-08-25 ENCOUNTER — Telehealth: Payer: Self-pay | Admitting: *Deleted

## 2017-08-25 MED ORDER — DULOXETINE HCL 30 MG PO CPEP
ORAL_CAPSULE | ORAL | 0 refills | Status: DC
Start: 2017-08-25 — End: 2017-10-06

## 2017-08-25 NOTE — Telephone Encounter (Signed)
Please call pt to schedule appt with [facility] ASAP. I have only sent a 2 week supply to his pharmacy.  [facility] day supply of duloxetine. Pt was just started on this medication on 07/18/17 and advised to f/u in 1 month. Pt was a no show on 08/18/17. A 2 week supply has been sent to his pharmacy. An email was sent to pt explaining refill quantity on 08/13/17.

## 2017-08-25 NOTE — Telephone Encounter (Signed)
Called pt and LVM advising pt to call back ASAP and schedule an OV and schedule a med check. Informed pt that a 2 week supply was sent but no other refills could be sent without a follow up visit.

## 2017-09-28 ENCOUNTER — Other Ambulatory Visit: Payer: Self-pay | Admitting: Family

## 2017-10-02 ENCOUNTER — Encounter: Payer: Self-pay | Admitting: Family

## 2017-10-02 ENCOUNTER — Other Ambulatory Visit: Payer: Self-pay | Admitting: Family

## 2017-10-04 MED ORDER — ATORVASTATIN CALCIUM 20 MG PO TABS: 20.0000 mg | ORAL_TABLET | Freq: Every day | ORAL | 5 refills | Status: DC

## 2017-10-06 MED ORDER — DULOXETINE HCL 30 MG PO CPEP
ORAL_CAPSULE | ORAL | 0 refills | Status: DC
Start: 2017-10-06 — End: 2017-10-13

## 2017-10-06 NOTE — Addendum Note (Signed)
Addended by: Mervin KungFERGERSON, Shakita Keir A on: 10/06/2017 04:21 PM   Modules accepted: Orders

## 2017-10-13 ENCOUNTER — Encounter: Payer: Self-pay | Admitting: Family

## 2017-10-13 ENCOUNTER — Ambulatory Visit: Payer: BLUE CROSS/BLUE SHIELD | Admitting: Family

## 2017-10-13 VITALS — BP 123/82 | HR 70 | Temp 98.0°F | Resp 16 | Ht 69.0 in | Wt 189.6 lb

## 2017-10-13 DIAGNOSIS — F32A Depression, unspecified: Secondary | ICD-10-CM

## 2017-10-13 DIAGNOSIS — F329 Major depressive disorder, single episode, unspecified: Secondary | ICD-10-CM

## 2017-10-13 MED ORDER — DULOXETINE HCL 60 MG PO CPEP: 60.0000 mg | ORAL_CAPSULE | Freq: Every day | ORAL | 5 refills | Status: DC

## 2017-10-13 NOTE — Patient Instructions (Signed)
Continue cymbalta 60 mg once daily 

## 2017-10-13 NOTE — Progress Notes (Signed)
Subjective:    Patient ID: Brad Hill, male    DOB: March 10, 1976, 42 y.o.   MRN: [id number]  HPI  Depression-last visit he noted suboptimal control of his depression on citalopram.  We weaned him down on citalopram and started Cymbalta.  He is currently maintained on Cymbalta 60 mg once daily and reports feeling well on this dose.  He feels like perhaps his appetite has diminished with this change slightly and he has lost a few pounds which he is pleased about.  He reports that he has had a lot of stress this year mostly with job and finances.  However he feels like things are starting to improve.  He wishes to continue current dose of Cymbalta as he reports feeling better on this medication.  Wt Readings from Last 3 Encounters:  10/13/17 189 lb 9.6 oz (86 kg)  07/18/17 195 lb (88.5 kg)  06/16/17 194 lb 2 oz (88.1 kg)     Review of Systems Past Medical History:  Diagnosis Date  . Depression   . Pneumonia      Social History   Socioeconomic History  . Marital status: Married    Spouse name: Not on file  . Number of children: Not on file  . Years of education: Not on file  . Highest education level: Not on file  Occupational History  . Not on file  Social Needs  . Financial resource strain: Not on file  . Food insecurity:    Worry: Not on file    Inability: Not on file  . Transportation needs:    Medical: Not on file    Non-medical: Not on file  Tobacco Use  . Smoking status: Never Smoker  . Smokeless tobacco: Never Used  Substance and Sexual Activity  . Alcohol use: Yes    Alcohol/week: 0.0 oz    Comment: occasional "less than once a week"  . Drug use: No  . Sexual activity: Not on file  Lifestyle  . Physical activity:    Days per week: Not on file    Minutes per session: Not on file  . Stress: Not on file  Relationships  . Social connections:    Talks on phone: Not on file    Gets together: Not on file    Attends religious service: Not on file    Active  member of club or organization: Not on file    Attends meetings of clubs or organizations: Not on file    Relationship status: Not on file  . Intimate partner violence:    Fear of current or ex partner: Not on file    Emotionally abused: Not on file    Physically abused: Not on file    Forced sexual activity: Not on file  Other Topics Concern  . Not on file  Social History Narrative   Has two children ages 2011 [name](boy) and 559 sarah 2006(daughter), Alycia RossettiRyan 5/17- he has full legal custody   Works as a Patent examinerpastor   [place]   Enjoys sports, exercises some, spending time with family   Completed masters degree (divinity/theology)       Past Surgical History:  Procedure Laterality Date  . UMBILICAL HERNIA REPAIR  1/16  . WISDOM TOOTH EXTRACTION      Family History  Problem Relation Age of Onset  . Hypertension Mother   . Cancer Father [phone]       prostate- living, s/p surgery  . Cancer Maternal Grandfather [phone]  prostate  . Cancer Paternal Grandfather 18       prostate     Allergies  Allergen Reactions  . Erythromycin Nausea Only    Current Outpatient Medications on File Prior to Visit  Medication Sig Dispense Refill  . atorvastatin (LIPITOR) 20 MG tablet Take 1 tablet (20 mg total) by mouth daily. 30 tablet 5  . DULoxetine (CYMBALTA) 30 MG capsule Take 2 tablets once daily 14 capsule 0  . fenofibrate (TRICOR) 145 MG tablet TAKE 1 TABLET BY MOUTH EVERY DAY 90 tablet 1  . Omega-3 Fatty Acids (FISH OIL) 1000 MG CAPS Take 2 capsules by mouth 2 (two) times daily.     No current facility-administered medications on file prior to visit.     BP 123/82 (BP Location: Right Arm, Cuff Size: Normal)   Pulse 70   Temp 98 F (36.7 C) (Oral)   Resp 16   Ht 5\' 9"  (1.753 m)   Wt 189 lb 9.6 oz (86 kg)   SpO2 99%   BMI 28.00 kg/m       Objective:   Physical Exam  Constitutional: He is oriented to person, place, and time. He appears well-developed and well-nourished. No  distress.  Neurological: He is alert and oriented to person, place, and time.  Skin: Skin is warm and dry.          Assessment & Plan:  Depression-this is stable and improved on current dose of Cymbalta.  Will continue same.  Remain off of citalopram.  He is pleased about his weight loss.  I have advised him to let me know if he has further unintentional weight loss.  Otherwise we will plan to bring him back in 6 months for follow-up.   A total of 15 minutes were spent face-to-face with the patient during this encounter and over half of that time was spent on counseling and coordination of care. The patient was counseled on depression/stress/treatment of depression.

## 2017-11-04 ENCOUNTER — Other Ambulatory Visit: Payer: Self-pay | Admitting: Family

## 2018-01-16 ENCOUNTER — Encounter: Payer: Self-pay | Admitting: Family

## 2018-01-19 NOTE — Telephone Encounter (Signed)
Selena BattenKim, can you please remove no-show charge? See mychart message.

## 2018-04-10 ENCOUNTER — Ambulatory Visit: Payer: BLUE CROSS/BLUE SHIELD | Admitting: Medical

## 2018-04-10 ENCOUNTER — Ambulatory Visit (HOSPITAL_BASED_OUTPATIENT_CLINIC_OR_DEPARTMENT_OTHER)
Admission: RE | Admit: 2018-04-10 | Discharge: 2018-04-10 | Disposition: A | Discharge status: Home / Self Care | Payer: BLUE CROSS/BLUE SHIELD | Source: Ambulatory Visit | Attending: Medical | Admitting: Medical

## 2018-04-10 ENCOUNTER — Encounter: Payer: Self-pay | Admitting: Medical

## 2018-04-10 VITALS — BP 129/79 | HR 88 | Temp 98.5°F

## 2018-04-10 DIAGNOSIS — R05 Cough: Secondary | ICD-10-CM | POA: Insufficient documentation

## 2018-04-10 DIAGNOSIS — J029 Acute pharyngitis, unspecified: Secondary | ICD-10-CM

## 2018-04-10 DIAGNOSIS — R059 Cough, unspecified: Secondary | ICD-10-CM

## 2018-04-10 MED ORDER — BENZONATATE 100 MG PO CAPS: 100.0000 mg | ORAL_CAPSULE | Freq: Three times a day (TID) | ORAL | 0 refills | Status: DC | PRN

## 2018-04-10 MED ORDER — AMOXICILLIN-POT CLAVULANATE 875-125 MG PO TABS: 1.0000 | ORAL_TABLET | Freq: Two times a day (BID) | ORAL | 0 refills | Status: DC

## 2018-04-10 MED ORDER — FLUTICASONE PROPIONATE 50 MCG/ACT NA SUSP: 2.0000 | Freq: Every day | NASAL | 1 refills | Status: DC

## 2018-04-10 NOTE — Patient Instructions (Addendum)
Your strep test was negative. However, your physical exam and clinical presentation is suspicious for strep and it is important to note that rapid strep test can be falsely negative. So I am going to give you augmentin  antibiotic today based on your exam and clinical presentation.  Some sinus pressure. Augmentin has good coverage for sinus infection.  Faint rough breath sound rt lower lobe. May need to rx azithromycin if pneumonia shows. Rx advisement.  Rest hydrate, tylenol for fever, and warm salt water gargles.   For nasal congestion  flonase. For cough benzonatate.  Follow up in 7 days or as needed.

## 2018-04-10 NOTE — Progress Notes (Signed)
Subjective:    Patient ID: Brad Hill, male    DOB: 1975-05-04, [age] y.o.   MRN: [id number]  HPI  Pt in for recent st((daughter with strep). He does have some mild st recently for past 4-5 days. He has sinus pressure, sinus pain, runny nose, and cough. He states recent fatigue.   Some productive cough.  No diffuse body aches.    He also mentions that he got pneumonia 3 times.    Review of Systems  Constitutional: Positive for fatigue. Negative for chills and fever.  HENT: Positive for congestion, sinus pressure, sneezing and sore throat.   Respiratory: Positive for cough. Negative for choking, chest tightness, shortness of breath and wheezing.   Cardiovascular: Negative for chest pain and palpitations.  Gastrointestinal: Negative for abdominal pain, constipation and vomiting.  Musculoskeletal: Negative for back pain and neck pain.  Skin: Negative for rash.  Neurological: Negative for dizziness and headaches.  Hematological: Negative for adenopathy. Does not bruise/bleed easily.  Psychiatric/Behavioral: Negative for behavioral problems and confusion. The patient is not nervous/anxious.    Past Medical History:  Diagnosis Date  . Depression   . Pneumonia      Social History   Socioeconomic History  . Marital status: Married    Spouse name: Not on file  . Number of children: Not on file  . Years of education: Not on file  . Highest education level: Not on file  Occupational History  . Not on file  Social Needs  . Financial resource strain: Not on file  . Food insecurity:    Worry: Not on file    Inability: Not on file  . Transportation needs:    Medical: Not on file    Non-medical: Not on file  Tobacco Use  . Smoking status: Never Smoker  . Smokeless tobacco: Never Used  Substance and Sexual Activity  . Alcohol use: Yes    Alcohol/week: 0.0 standard drinks    Comment: occasional "less than once a week"  . Drug use: No  . Sexual activity: Not on file    Lifestyle  . Physical activity:    Days per week: Not on file    Minutes per session: Not on file  . Stress: Not on file  Relationships  . Social connections:    Talks on phone: Not on file    Gets together: Not on file    Attends religious service: Not on file    Active member of club or organization: Not on file    Attends meetings of clubs or organizations: Not on file    Relationship status: Not on file  . Intimate partner violence:    Fear of current or ex partner: Not on file    Emotionally abused: Not on file    Physically abused: Not on file    Forced sexual activity: Not on file  Other Topics Concern  . 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    [place]   Enjoys sports, exercises some, spending time with family   Completed masters degree (divinity/theology)       Past Surgical History:  Procedure Laterality Date  . UMBILICAL HERNIA REPAIR  1/16  . WISDOM TOOTH EXTRACTION      Family History  Problem Relation Age of Onset  . Hypertension Mother   . Cancer Father 21       prostate- living, s/p surgery  . Cancer Maternal Grandfather 21       prostate  . Cancer Paternal Grandfather 57       prostate     Allergies  Allergen Reactions  . Erythromycin Nausea Only    Current Outpatient Medications on File Prior to Visit  Medication Sig Dispense Refill  . atorvastatin (LIPITOR) 20 MG tablet Take 1 tablet (20 mg total) by mouth daily. 30 tablet 5  . DULoxetine (CYMBALTA) 60 MG capsule TAKE 1 CAPSULE BY MOUTH EVERY DAY 90 capsule 1  . fenofibrate (TRICOR) 145 MG tablet TAKE 1 TABLET BY MOUTH EVERY DAY 90 tablet 1  . Omega-3 Fatty Acids (FISH OIL) 1000 MG CAPS Take 2 capsules by mouth 2 (two) times daily.     No current facility-administered medications on file  prior to visit.     BP 129/79   Pulse 88   Temp 98.5 F (36.9 C) (Oral)       Objective:   Physical Exam  General  Mental Status - Alert. General Appearance - Well groomed. Not in acute distress.  Skin Rashes- No Rashes.  HEENT Head- Normal. Ear Auditory Canal - Left- Normal. Right - Normal.Tympanic Membrane- Left- Normal. Right- Normal. Eye Sclera/Conjunctiva- Left- Normal. Right- Normal. Nose & Sinuses Nasal Mucosa- Left-  Boggy and Congested. Right-  Boggy and  Congested.Bilateral  No maxillary and  No frontal sinus pressure. Mouth & Throat Lips: Upper Lip- Normal: no dryness, cracking, pallor, cyanosis, or vesicular eruption. Lower Lip-Normal: no dryness, cracking, pallor, cyanosis or vesicular eruption. Buccal Mucosa- Bilateral- No Aphthous ulcers. Oropharynx- No Discharge or Erythema. Tonsils: Characteristics- Bilateral- mild  Erythema or Congestion. Size/Enlargement- Bilateral- 1+  enlargement. Discharge- bilateral-None.  Neck Neck- Supple. No Masses.   Chest and Lung Exam Auscultation: Breath Sounds:-even and unlabored. Faint rough breath sounds rt lower lobe.  Cardiovascular Auscultation:Rythm- Regular, rate and rhythm. Murmurs & Other Heart Sounds:Ausculatation of the heart reveal- No Murmurs.  Lymphatic Head & Neck General Head & Neck Lymphatics: Bilateral: Description- mild enlarged rt submancdibular node.       Assessment & Plan:  Your strep test was negative. However, your physical exam and clinical presentation is suspicious for strep and it is important to note that rapid strep test can be falsely negative. So I am going to give you augmentin  antibiotic today based on your exam and clinical presentation.  Some sinus pressure. Augmentin has good coverage for sinus infection.  Faint rough breath sound rt lower lobe. May need to rx azithromycin if pneumonia shows. Rx advisement.  Rest hydrate, tylenol for fever, and warm salt water gargles.    For nasal congestion  flonase. For cough benzonatate.  Follow up in 7 days or as needed.   Esperanza Richters, PA-C

## 2018-04-14 ENCOUNTER — Ambulatory Visit: Payer: BLUE CROSS/BLUE SHIELD | Admitting: Family

## 2018-04-14 ENCOUNTER — Encounter: Payer: Self-pay | Admitting: Family

## 2018-04-14 VITALS — BP 134/81 | HR 70 | Temp 98.6°F | Resp 16 | Ht 69.0 in | Wt 197.0 lb

## 2018-04-14 DIAGNOSIS — F329 Major depressive disorder, single episode, unspecified: Secondary | ICD-10-CM

## 2018-04-14 DIAGNOSIS — F32A Depression, unspecified: Secondary | ICD-10-CM

## 2018-04-14 DIAGNOSIS — J329 Chronic sinusitis, unspecified: Secondary | ICD-10-CM | POA: Diagnosis not present

## 2018-04-14 DIAGNOSIS — J40 Bronchitis, not specified as acute or chronic: Secondary | ICD-10-CM | POA: Diagnosis not present

## 2018-04-14 DIAGNOSIS — R635 Abnormal weight gain: Secondary | ICD-10-CM | POA: Diagnosis not present

## 2018-04-14 MED ORDER — DULOXETINE HCL 60 MG PO CPEP: ORAL_CAPSULE | ORAL | 1 refills | Status: DC

## 2018-04-14 NOTE — Progress Notes (Signed)
Subjective:    Patient ID: Brad Hill, male    DOB: 1975-12-13, 42 y.o.   MRN: [id number]  HPI  Patient is a 42 year old male who presents today for follow-up of his depression.  He notes that his depression symptoms are much improved on current dose of Cymbalta.  He notes that he has ongoing stressors but things seem to have stabilized some.  He is expecting a new baby girl in April which is a surprise but they are excited about it.  He has had some weight gain since his last visit.   Wt Readings from Last 3 Encounters:  04/14/18 197 lb (89.4 kg)  10/13/17 189 lb 9.6 oz (86 kg)  07/18/17 195 lb (88.5 kg)   Bronchitis/sinusitis-he was treated last week by another provider in our clinic and reports feeling much improved this week.  Review of Systems     Past Medical History:  Diagnosis Date  . Depression   . Pneumonia      Social History   Socioeconomic History  . Marital status: Married    Spouse name: Not on file  . Number of children: Not on file  . Years of education: Not on file  . Highest education level: Not on file  Occupational History  . Not on file  Social Needs  . Financial resource strain: Not on file  . Food insecurity:    Worry: Not on file    Inability: Not on file  . Transportation needs:    Medical: Not on file    Non-medical: Not on file  Tobacco Use  . Smoking status: Never Smoker  . Smokeless tobacco: Never Used  Substance and Sexual Activity  . Alcohol use: Yes    Alcohol/week: 0.0 standard drinks    Comment: occasional "less than once a week"  . Drug use: No  . Sexual activity: Not on file  Lifestyle  . Physical activity:    Days per week: Not on file    Minutes per session: Not on file  . Stress: Not on file  Relationships  . Social connections:    Talks on phone: Not on file    Gets together: Not on file    Attends religious service: Not on file    Active member of club or organization: Not on file    Attends meetings of clubs  or organizations: Not on file    Relationship status: Not on file  . Intimate partner violence:    Fear of current or ex partner: Not on file    Emotionally abused: Not on file    Physically abused: Not on file    Forced sexual activity: Not on file  Other Topics Concern  . 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    [place]   Enjoys sports, exercises some, spending time with family   Completed masters degree (divinity/theology)       Past Surgical History:  Procedure Laterality Date  . UMBILICAL HERNIA REPAIR  1/16  . WISDOM TOOTH EXTRACTION      Family History  Problem Relation Age of Onset  . Hypertension Mother   . Cancer Father [phone]       prostate- living, s/p surgery  . Cancer Maternal Grandfather [phone]  prostate  . Cancer Paternal Grandfather 87       prostate     Allergies  Allergen Reactions  . Erythromycin Nausea Only    Current Outpatient Medications on File Prior to Visit  Medication Sig Dispense Refill  . amoxicillin-clavulanate (AUGMENTIN) 875-125 MG tablet Take 1 tablet by mouth 2 (two) times daily. 20 tablet 0  . atorvastatin (LIPITOR) 20 MG tablet Take 1 tablet (20 mg total) by mouth daily. 30 tablet 5  . benzonatate (TESSALON) 100 MG capsule Take 1 capsule (100 mg total) by mouth 3 (three) times daily as needed for cough. 30 capsule 0  . fenofibrate (TRICOR) 145 MG tablet TAKE 1 TABLET BY MOUTH EVERY DAY 90 tablet 1  . fluticasone (FLONASE) 50 MCG/ACT nasal spray Place 2 sprays into both nostrils daily. 16 g 1  . Omega-3 Fatty Acids (FISH OIL) 1000 MG CAPS Take 2 capsules by mouth 2 (two) times daily.     No current facility-administered medications on file prior to visit.     BP 134/81 (BP Location: Right Arm, Patient Position: Sitting, Cuff Size:  Small)   Pulse 70   Temp 98.6 F (37 C) (Oral)   Resp 16   Ht 5\' 9"  (1.753 m)   Wt 197 lb (89.4 kg)   SpO2 97%   BMI 29.09 kg/m    Objective:   Physical Exam Constitutional:      General: He is not in acute distress.    Appearance: He is well-developed.  HENT:     Head: Normocephalic and atraumatic.  Cardiovascular:     Rate and Rhythm: Normal rate and regular rhythm.     Heart sounds: No murmur.  Pulmonary:     Effort: Pulmonary effort is normal. No respiratory distress.     Breath sounds: Normal breath sounds. No wheezing or rales.  Skin:    General: Skin is warm and dry.  Neurological:     Mental Status: He is alert and oriented to person, place, and time.  Psychiatric:        Behavior: Behavior normal.        Thought Content: Thought content normal.           Assessment & Plan:  Bronchitis/sinusitis-clinically improved.  Continue antibiotics.  Depression- clinically stable on Cymbalta.  Continue same.  Weight gain-we discussed importance of healthy diet, exercise and weight loss.

## 2018-04-14 NOTE — Patient Instructions (Signed)
Please continue current dose of cymbalta.  

## 2018-05-18 ENCOUNTER — Encounter: Payer: Self-pay | Admitting: Family

## 2018-08-18 ENCOUNTER — Encounter: Payer: BLUE CROSS/BLUE SHIELD | Admitting: Family

## 2018-08-29 ENCOUNTER — Other Ambulatory Visit: Payer: Self-pay

## 2018-08-29 ENCOUNTER — Ambulatory Visit (INDEPENDENT_AMBULATORY_CARE_PROVIDER_SITE_OTHER): Payer: 59

## 2018-08-29 ENCOUNTER — Ambulatory Visit (HOSPITAL_COMMUNITY)
Admission: EM | Admit: 2018-08-29 | Discharge: 2018-08-29 | Disposition: A | Discharge status: Home / Self Care | Payer: 59 | Attending: Family Medicine | Admitting: Family Medicine

## 2018-08-29 DIAGNOSIS — S92355A Nondisplaced fracture of fifth metatarsal bone, left foot, initial encounter for closed fracture: Secondary | ICD-10-CM

## 2018-08-29 MED ORDER — HYDROCODONE-ACETAMINOPHEN 5-325 MG PO TABS: 1.0000 | ORAL_TABLET | Freq: Four times a day (QID) | ORAL | 0 refills | Status: DC | PRN

## 2018-08-29 MED ORDER — IBUPROFEN 600 MG PO TABS: 600.0000 mg | ORAL_TABLET | Freq: Four times a day (QID) | ORAL | 0 refills | Status: DC | PRN

## 2018-08-29 NOTE — Discharge Instructions (Signed)
You fractured the base of your fifth metatarsal Please wear crutches and boot and do not bear weight on foot until following up with orthopedics Please contact info below for orthopedic follow-up  Use anti-inflammatories for pain/swelling. You may take up to 600-800 mg Ibuprofen every 8 hours with food. You may supplement Ibuprofen with Tylenol [phone] mg every 8 hours.   May use hydrocodone for severe pain, do not drive or work after taking.  May remove boot and ice foot, but wear boot during the day and with sleeping  Follow-up if developing increased pain, swelling, numbness or tingling or coolness to the foot

## 2018-08-29 NOTE — ED Triage Notes (Signed)
Stepped down wrong today and twisted his left foot. Can't put pressure on it and hard to walk. Swelling and it is the top of the foot.

## 2018-08-30 NOTE — ED Provider Notes (Signed)
[facility]    CSN: [id number] Arrival date & time: 08/29/18  1537     History   Chief Complaint Chief Complaint  Patient presents with  . Foot Injury    HPI Brad Hill is a 43 y.o. male no contributing past medical history presenting today for evaluation of left foot injury.  Patient states that he stepped wrong off of a curb and this caused his foot to roll and twist.  He believes he felt a pop.  Since he has had pain with weightbearing and difficulty walking.  He is also developed swelling along the outer aspect of his foot.  Denies any previous injury or fracture to this foot.  Denies numbness or tingling.  HPI  Past Medical History:  Diagnosis Date  . Depression   . Pneumonia     Patient Active Problem List   Diagnosis Date Noted  . Hypertriglyceridemia 12/17/2014  . Preventative health care 12/12/2014  . Thoracic back pain 11/22/2014  . Umbilical hernia 03/14/2014  . Depression 03/14/2014    Past Surgical History:  Procedure Laterality Date  . UMBILICAL HERNIA REPAIR  1/16  . WISDOM TOOTH EXTRACTION         Home Medications    Prior to Admission medications   Medication Sig Start Date End Date Taking? Authorizing Provider  amoxicillin-clavulanate (AUGMENTIN) 875-125 MG tablet Take 1 tablet by mouth 2 (two) times daily. 04/10/18   Saguier, Ramon Dredge, PA-C  atorvastatin (LIPITOR) 20 MG tablet Take 1 tablet (20 mg total) by mouth daily. 10/04/17   Sandford Craze, NP  benzonatate (TESSALON) 100 MG capsule Take 1 capsule (100 mg total) by mouth 3 (three) times daily as needed for cough. 04/10/18   Saguier, Ramon Dredge, PA-C  DULoxetine (CYMBALTA) 60 MG capsule TAKE 1 CAPSULE BY MOUTH EVERY DAY 04/14/18   Sandford Craze, NP  fenofibrate (TRICOR) 145 MG tablet TAKE 1 TABLET BY MOUTH EVERY DAY 09/29/17   Sandford Craze, NP  fluticasone (FLONASE) 50 MCG/ACT nasal spray Place 2 sprays into both nostrils daily. 04/10/18   Saguier, Ramon Dredge, PA-C   HYDROcodone-acetaminophen (NORCO/VICODIN) 5-325 MG tablet Take 1 tablet by mouth every 6 (six) hours as needed for severe pain. 08/29/18   Brianah Hopson C, PA-C  ibuprofen (ADVIL) 600 MG tablet Take 1 tablet (600 mg total) by mouth every 6 (six) hours as needed. 08/29/18   Sabrin Dunlevy C, PA-C  Omega-3 Fatty Acids (FISH OIL) 1000 MG CAPS Take 2 capsules by mouth 2 (two) times daily.    [provider]    Family History Family History  Problem Relation Age of Onset  . Hypertension Mother   . Cancer Father 94       prostate- living, s/p surgery  . Cancer Maternal Grandfather 32       prostate  . Cancer Paternal Grandfather 77       prostate     Social History Social History   Tobacco Use  . Smoking status: Never Smoker  . Smokeless tobacco: Never Used  Substance Use Topics  . Alcohol use: Yes    Alcohol/week: 0.0 standard drinks    Comment: occasional "less than once a week"  . Drug use: No     Allergies   Erythromycin   Review of Systems Review of Systems  Constitutional: Negative for fatigue and fever.  Eyes: Negative for redness, itching and visual disturbance.  Respiratory: Negative for shortness of breath.   Cardiovascular: Negative for chest pain and leg swelling.  Gastrointestinal:  Negative for nausea and vomiting.  Musculoskeletal: Positive for arthralgias, joint swelling and myalgias.  Skin: Negative for color change, rash and wound.  Neurological: Negative for dizziness, syncope, weakness, light-headedness and headaches.     Physical Exam Triage Vital Signs ED Triage Vitals  Enc Vitals Group     BP 08/29/18 1622 116/69     Pulse Rate 08/29/18 1622 76     Resp 08/29/18 1622 16     Temp 08/29/18 1622 98.1 F (36.7 C)     Temp Source 08/29/18 1622 Oral     SpO2 08/29/18 1622 97 %     Weight --      Height --      Head Circumference --      Peak Flow --      Pain Score 08/29/18 1623 7     Pain Loc --      Pain Edu? --      Excl. in GC?  --    No data found.  Updated Vital Signs BP 116/69 (BP Location: Right Arm)   Pulse 76   Temp 98.1 F (36.7 C) (Oral)   Resp 16   SpO2 97%   Visual Acuity Right Eye Distance:   Left Eye Distance:   Bilateral Distance:    Right Eye Near:   Left Eye Near:    Bilateral Near:     Physical Exam Vitals signs and nursing note reviewed.  Constitutional:      Appearance: He is well-developed.     Comments: No acute distress  HENT:     Head: Normocephalic and atraumatic.     Nose: Nose normal.  Eyes:     Conjunctiva/sclera: Conjunctivae normal.  Neck:     Musculoskeletal: Neck supple.  Cardiovascular:     Rate and Rhythm: Normal rate.  Pulmonary:     Effort: Pulmonary effort is normal. No respiratory distress.  Abdominal:     General: There is no distension.  Musculoskeletal: Normal range of motion.     Comments: Left foot: Obvious swelling with slight bruising discoloration about lateral aspect of foot/proximal fifth tarsal.  Tenderness around this area as well, nontender along medial and lateral malleolus, dorsalis pedis 2+, cap refill brisk able to wiggle toes, slightly limited range of motion at ankle due to pain  Skin:    General: Skin is warm and dry.  Neurological:     Mental Status: He is alert and oriented to person, place, and time.      UC Treatments / Results  Labs (all labs ordered are listed, but only abnormal results are displayed) Labs Reviewed - No data to display  EKG None  Radiology Dg Foot Complete Left  Result Date: 08/29/2018 CLINICAL DATA:  Twist injury, pain and swelling at base of fifth metatarsal EXAM: LEFT FOOT - COMPLETE 3+ VIEW COMPARISON:  None FINDINGS: Osseous mineralization normal. Joint spaces preserved. Soft tissue swelling lateral to the fifth metatarsal base. Transverse nondisplaced fracture at base of LEFT fifth metatarsal. No additional fracture, dislocation, or bone destruction. IMPRESSION: Transverse nondisplaced fracture at  base of LEFT fifth metatarsal. Electronically Signed   By: Ulyses SouthwardMark  Boles M.D.   On: 08/29/2018 17:18    Procedures Procedures (including critical care time)  Medications Ordered in UC Medications - No data to display  Initial Impression / Assessment and Plan / UC Course  I have reviewed the triage vital signs and the nursing notes.  Pertinent labs & imaging results that were available during my  care of the patient were reviewed by me and considered in my medical decision making (see chart for details).     Nondisplaced fracture at base of fifth metatarsal, will place in postop boot/crutches and follow-up with orthopedics.  Nonweightbearing until instructed by Ortho.  Discussed typical course of 4 to 6 weeks.  Fractures to heal.  May apply ice.  Tylenol and ibuprofen for pain.  Did provide a few tablets of hydrocodone to use for severe pain.  Discussed drowsiness and risk of addiction with these.  Advised to use sparingly, do not drive or work after taking.Discussed strict return precautions. Patient verbalized understanding and is agreeable with plan.  Final Clinical Impressions(s) / UC Diagnoses   Final diagnoses:  Closed nondisplaced fracture of fifth metatarsal bone of left foot, initial encounter     Discharge Instructions     You fractured the base of your fifth metatarsal Please wear crutches and boot and do not bear weight on foot until following up with orthopedics Please contact info below for orthopedic follow-up  Use anti-inflammatories for pain/swelling. You may take up to 600-800 mg Ibuprofen every 8 hours with food. You may supplement Ibuprofen with Tylenol [phone] mg every 8 hours.   May use hydrocodone for severe pain, do not drive or work after taking.  May remove boot and ice foot, but wear boot during the day and with sleeping  Follow-up if developing increased pain, swelling, numbness or tingling or coolness to the foot    ED Prescriptions    Medication  Sig Dispense Auth. Provider   ibuprofen (ADVIL) 600 MG tablet Take 1 tablet (600 mg total) by mouth every 6 (six) hours as needed. 30 tablet Maureen Delatte C, PA-C   HYDROcodone-acetaminophen (NORCO/VICODIN) 5-325 MG tablet Take 1 tablet by mouth every 6 (six) hours as needed for severe pain. 12 tablet Hoyte Ziebell, [place] C, PA-C     Controlled Substance Prescriptions [place] Controlled Substance Registry consulted? Yes, I have consulted [place] Controlled [place]stances Registry for this patient, and feel the risk/benefit ratio today is favorable for proceeding with this prescription for a controlled substance.   Sharyon Cable [place] C, PA-C 08/30/18 0945

## 2018-09-25 ENCOUNTER — Other Ambulatory Visit: Payer: Self-pay | Admitting: Family

## 2018-11-23 ENCOUNTER — Other Ambulatory Visit: Payer: Self-pay | Admitting: Family

## 2018-12-07 ENCOUNTER — Other Ambulatory Visit: Payer: Self-pay | Admitting: Family

## 2019-06-05 ENCOUNTER — Other Ambulatory Visit: Payer: Self-pay | Admitting: Family

## 2019-06-06 ENCOUNTER — Other Ambulatory Visit: Payer: Self-pay | Admitting: Family

## 2019-06-10 ENCOUNTER — Other Ambulatory Visit: Payer: Self-pay | Admitting: Family

## 2019-06-11 NOTE — Telephone Encounter (Signed)
Duloxetine refill on hold pending pt's mychart response. Has not been seen since 03/2018 and past due for f/u if still seeing Melissa for PCP.

## 2019-06-14 NOTE — Telephone Encounter (Signed)
Attempted to reach pt by phone regarding refill from last week and need for OV for refills if he is still seeing Melissa for PCP. Left detailed message on voicemail.

## 2019-06-25 NOTE — Telephone Encounter (Signed)
06/11/19 mychart message came back as unread.  Mailed letter. 

## 2019-08-10 ENCOUNTER — Other Ambulatory Visit: Payer: Self-pay | Admitting: Family

## 2019-09-02 ENCOUNTER — Other Ambulatory Visit: Payer: Self-pay | Admitting: Family

## 2019-09-03 ENCOUNTER — Other Ambulatory Visit: Payer: Self-pay | Admitting: Family

## 2019-09-06 ENCOUNTER — Other Ambulatory Visit: Payer: Self-pay | Admitting: Family

## 2019-09-06 NOTE — Telephone Encounter (Signed)
Please call pt to schedule OV, he is past due. I sent a 2 week supply of his medicine.

## 2019-09-14 ENCOUNTER — Encounter: Payer: Self-pay | Admitting: Family

## 2019-09-14 ENCOUNTER — Ambulatory Visit: Payer: 59 | Admitting: Family

## 2019-09-24 ENCOUNTER — Encounter: Payer: Self-pay | Admitting: Family

## 2019-10-18 ENCOUNTER — Ambulatory Visit (INDEPENDENT_AMBULATORY_CARE_PROVIDER_SITE_OTHER): Payer: Self-pay | Admitting: Family

## 2019-10-18 ENCOUNTER — Other Ambulatory Visit: Payer: Self-pay

## 2019-10-18 ENCOUNTER — Encounter: Payer: Self-pay | Admitting: Family

## 2019-10-18 VITALS — BP 125/85 | HR 66 | Temp 97.8°F | Resp 16 | Ht 69.0 in | Wt 192.0 lb

## 2019-10-18 DIAGNOSIS — F329 Major depressive disorder, single episode, unspecified: Secondary | ICD-10-CM

## 2019-10-18 DIAGNOSIS — Z0000 Encounter for general adult medical examination without abnormal findings: Secondary | ICD-10-CM

## 2019-10-18 DIAGNOSIS — F32A Depression, unspecified: Secondary | ICD-10-CM

## 2019-10-18 DIAGNOSIS — Z8042 Family history of malignant neoplasm of prostate: Secondary | ICD-10-CM

## 2019-10-18 DIAGNOSIS — E785 Hyperlipidemia, unspecified: Secondary | ICD-10-CM

## 2019-10-18 DIAGNOSIS — E1169 Type 2 diabetes mellitus with other specified complication: Secondary | ICD-10-CM

## 2019-10-18 DIAGNOSIS — F988 Other specified behavioral and emotional disorders with onset usually occurring in childhood and adolescence: Secondary | ICD-10-CM

## 2019-10-18 HISTORY — DX: Other specified behavioral and emotional disorders with onset usually occurring in childhood and adolescence: F98.8

## 2019-10-18 LAB — CBC WITH DIFFERENTIAL/PLATELET
Basophils Absolute: 0.1 10*3/uL (ref 0.0–0.1)
Basophils Relative: 0.6 % (ref 0.0–3.0)
Eosinophils Absolute: 0.2 10*3/uL (ref 0.0–0.7)
Eosinophils Relative: 2.6 % (ref 0.0–5.0)
HCT: 42.8 % (ref 39.0–52.0)
Hemoglobin: 14.6 g/dL (ref 13.0–17.0)
Lymphocytes Relative: 41.9 % (ref 12.0–46.0)
Lymphs Abs: 3.4 10*3/uL (ref 0.7–4.0)
MCHC: 34.2 g/dL (ref 30.0–36.0)
MCV: 86.7 fl (ref 78.0–100.0)
Monocytes Absolute: 0.5 10*3/uL (ref 0.1–1.0)
Monocytes Relative: 6.6 % (ref 3.0–12.0)
Neutro Abs: 4 10*3/uL (ref 1.4–7.7)
Neutrophils Relative %: 48.3 % (ref 43.0–77.0)
Platelets: 329 10*3/uL (ref 150.0–400.0)
RBC: 4.94 Mil/uL (ref 4.22–5.81)
RDW: 12.8 % (ref 11.5–15.5)
WBC: 8.2 10*3/uL (ref 4.0–10.5)

## 2019-10-18 LAB — PSA: PSA: 0.98 ng/mL (ref 0.10–4.00)

## 2019-10-18 LAB — HEPATIC FUNCTION PANEL
ALT: 20 U/L (ref 0–53)
AST: 19 U/L (ref 0–37)
Albumin: 4.7 g/dL (ref 3.5–5.2)
Alkaline Phosphatase: 76 U/L (ref 39–117)
Bilirubin, Direct: 0.1 mg/dL (ref 0.0–0.3)
Total Bilirubin: 0.4 mg/dL (ref 0.2–1.2)
Total Protein: 7 g/dL (ref 6.0–8.3)

## 2019-10-18 LAB — BASIC METABOLIC PANEL
BUN: 15 mg/dL (ref 6–23)
CO2: 27 mEq/L (ref 19–32)
Calcium: 10 mg/dL (ref 8.4–10.5)
Chloride: 102 mEq/L (ref 96–112)
Creatinine, Ser: 1.01 mg/dL (ref 0.40–1.50)
GFR: 79.95 mL/min (ref >60.00)
Glucose, Bld: 96 mg/dL (ref 70–99)
Potassium: 4.1 mEq/L (ref 3.5–5.1)
Sodium: 137 mEq/L (ref 135–145)

## 2019-10-18 LAB — LIPID PANEL
Cholesterol: 169 mg/dL (ref 0–200)
HDL: 38.4 mg/dL — ABNORMAL LOW (ref >39.00)
NonHDL: 130.21
Total CHOL/HDL Ratio: 4
Triglycerides: 234 mg/dL — ABNORMAL HIGH (ref 0.0–149.0)
VLDL: 46.8 mg/dL — ABNORMAL HIGH (ref 0.0–40.0)

## 2019-10-18 LAB — TSH: TSH: 1.32 u[IU]/mL (ref 0.35–4.50)

## 2019-10-18 LAB — LDL CHOLESTEROL, DIRECT: Direct LDL: 92 mg/dL

## 2019-10-18 MED ORDER — LISDEXAMFETAMINE DIMESYLATE 20 MG PO CAPS: 20.0000 mg | ORAL_CAPSULE | Freq: Every day | ORAL | 0 refills | Status: DC

## 2019-10-18 NOTE — Patient Instructions (Addendum)
Please schedule a routine eye exam.   Preventive Care 45-45 Years Old, Male Preventive care refers to lifestyle choices and visits with your health care provider that can promote health and wellness. This includes:  A yearly physical exam. This is also called an annual well check.  Regular dental and eye exams.  Immunizations.  Screening for certain conditions.  Healthy lifestyle choices, such as eating a healthy diet, getting regular exercise, not using drugs or products that contain nicotine and tobacco, and limiting alcohol use. What can I expect for my preventive care visit? Physical exam Your health care provider will check:  Height and weight. These may be used to calculate body mass index (BMI), which is a measurement that tells if you are at a healthy weight.  Heart rate and blood pressure.  Your skin for abnormal spots. Counseling Your health care provider may ask you questions about:  Alcohol, tobacco, and drug use.  Emotional well-being.  Home and relationship well-being.  Sexual activity.  Eating habits.  Work and work Statistician. What immunizations do I need?  Influenza (flu) vaccine  This is recommended every year. Tetanus, diphtheria, and pertussis (Tdap) vaccine  You may need a Td booster every 10 years. Varicella (chickenpox) vaccine  You may need this vaccine if you have not already been vaccinated. Zoster (shingles) vaccine  You may need this after age 45. Measles, mumps, and rubella (MMR) vaccine  You may need at least one dose of MMR if you were born in 1957 or later. You may also need a second dose. Pneumococcal conjugate (PCV13) vaccine  You may need this if you have certain conditions and were not previously vaccinated. Pneumococcal polysaccharide (PPSV23) vaccine  You may need one or two doses if you smoke cigarettes or if you have certain conditions. Meningococcal conjugate (MenACWY) vaccine  You may need this if you have  certain conditions. Hepatitis A vaccine  You may need this if you have certain conditions or if you travel or work in places where you may be exposed to hepatitis A. Hepatitis B vaccine  You may need this if you have certain conditions or if you travel or work in places where you may be exposed to hepatitis B. Haemophilus influenzae type b (Hib) vaccine  You may need this if you have certain risk factors. Human papillomavirus (HPV) vaccine  If recommended by your health care provider, you may need three doses over 6 months. You may receive vaccines as individual doses or as more than one vaccine together in one shot (combination vaccines). Talk with your health care provider about the risks and benefits of combination vaccines. What tests do I need? Blood tests  Lipid and cholesterol levels. These may be checked every 5 years, or more frequently if you are over 45 years old.  Hepatitis C test.  Hepatitis B test. Screening  Lung cancer screening. You may have this screening every year starting at age 45 if you have a 30-pack-year history of smoking and currently smoke or have quit within the past 15 years.  Prostate cancer screening. Recommendations will vary depending on your family history and other risks.  Colorectal cancer screening. All adults should have this screening starting at age 45 and continuing until age 45. Your health care provider may recommend screening at age 45 if you are at increased risk. You will have tests every 1-10 years, depending on your results and the type of screening test.  Diabetes screening. This is done by checking your  blood sugar (glucose) after you have not eaten for a while (fasting). You may have this done every 1-3 years.  Sexually transmitted disease (STD) testing. Follow these instructions at home: Eating and drinking  Eat a diet that includes fresh fruits and vegetables, whole grains, lean protein, and low-fat dairy products.  Take  vitamin and mineral supplements as recommended by your health care provider.  Do not drink alcohol if your health care provider tells you not to drink.  If you drink alcohol: ? Limit how much you have to 0-2 drinks a day. ? Be aware of how much alcohol is in your drink. In the [place] one drink equals one 12 oz bottle of beer (355 mL), one 5 oz glass of wine (148 mL), or one 1 oz glass of hard liquor (44 mL). Lifestyle  Take daily care of your teeth and gums.  Stay active. Exercise for at least 30 minutes on 5 or more days each week.  Do not use any products that contain nicotine or tobacco, such as cigarettes, e-cigarettes, and chewing tobacco. If you need help quitting, ask your health care provider.  If you are sexually active, practice safe sex. Use a condom or other form of protection to prevent STIs (sexually transmitted infections).  Talk with your health care provider about taking a low-dose aspirin every day starting at age 45. What's next?  Go to your health care provider once a year for a well check visit.  Ask your health care provider how often you should have your eyes and teeth checked.  Stay up to date on all vaccines. This information is not intended to replace advice given to you by your health care provider. Make sure you discuss any questions you have with your health care provider. Document Revised: 04/09/2018 Document Reviewed: 04/09/2018 Elsevier Patient Education  2020 [employer].

## 2019-10-18 NOTE — Progress Notes (Signed)
Subjective:    Patient ID: Brad Hill, male    DOB: [birth year], 44 y.o.   MRN: [id number]  HPI  Patient presents today for complete physical.  Immunizations: completed covid series (moderna), tdap 2016 Diet: improving- cutting back on sugars Wt Readings from Last 3 Encounters:  10/18/19 192 lb (87.1 kg)  04/14/18 197 lb (89.4 kg)  10/13/17 189 lb 9.6 oz (86 kg)  Exercise: walking more in the evening.  Active at work Vision: due Dental: up to date  Depression-reports mood is good overall. Pleased with cymbalta, wishes to continue.  Hyperlipidemia- he is fasting. Maintained on lipitor and fenofibrate.   Lab Results  Component Value Date   CHOL 202 (H) 07/18/2017   HDL 34.80 (L) 07/18/2017   LDLCALC 100 (H) 09/18/2015   LDLDIRECT 103.0 07/18/2017   TRIG (H) 07/18/2017    419.0 Triglyceride is over 400; calculations on Lipids are invalid.   CHOLHDL 6 07/18/2017   Lab Results  Component Value Date   PSA 0.97 07/18/2017   PSA 0.72 06/20/2015    ADHD-he [facility]ue [facility]  Reports that he underwent testing and he did test positive for ADD.  He was recently started on Vyvanse 20 mg once daily.  He reports some mild improvement in his symptoms.  He has an upcoming follow-up scheduled with Dr. Elisabeth Most.  Review of Systems  Constitutional: Negative for unexpected weight change.  HENT: Negative for hearing loss and rhinorrhea.   Eyes: Negative for visual disturbance.  Respiratory: Negative for cough and shortness of breath.   Cardiovascular: Negative for chest pain.  Gastrointestinal: Negative for constipation and diarrhea.  Genitourinary: Negative for dysuria, frequency and hematuria.  Musculoskeletal: Negative for arthralgias and myalgias.  Skin: Negative for rash.  Neurological: Positive for headaches (Headache every couple of weeks, generally resolves with tylenol or advil).  Hematological: Negative for adenopathy.    Psychiatric/Behavioral:       Denies anxiety   Past Medical History:  Diagnosis Date  . Depression   . Pneumonia      Social History   Socioeconomic History  . Marital status: Married    Spouse name: Not on file  . Number of children: Not on file  . Years of education: Not on file  . Highest education level: Not on file  Occupational History  . Not on file  Tobacco Use  . Smoking status: Never Smoker  . Smokeless tobacco: Never Used  Substance and Sexual Activity  . Alcohol use: Yes    Alcohol/week: 0.0 standard drinks    Comment: occasional "less than once a week"  . Drug use: No  . Sexual activity: Not on file  Other Topics Concern  . 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    Grew up in [place],    Enjoys sports, exercises some, spending time with family   Completed masters degree (divinity/theology)      Optician, dispensing at the [employer]:   . Difficulty of Paying Living Expenses:   Food Insecurity:   . Worried About Programme researcher, broadcasting/film/video in the Last Year:   . Barista in the Last Year:   Transportation Needs:   .  Lack of Transportation (Medical):   Marland Kitchen Lack of Transportation (Non-Medical):   Physical Activity:   . Days of Exercise per Week:   . Minutes of Exercise per Session:   Stress:   . Feeling of Stress :   Social Connections:   . Frequency of Communication with Friends and Family:   . Frequency of Social Gatherings with Friends and Family:   . Attends Religious Services:   . Active Member of Clubs or Organizations:   . Attends Banker Meetings:   Marland Kitchen Marital Status:   Intimate Partner Violence:   . Fear of Current or Ex-Partner:   . Emotionally Abused:   Marland Kitchen Physically Abused:   .  Sexually Abused:     Past Surgical History:  Procedure Laterality Date  . UMBILICAL HERNIA REPAIR  1/16  . WISDOM TOOTH EXTRACTION      Family History  Problem Relation Age of Onset  . Hypertension Mother   . Cancer Father 40       prostate- living, s/p surgery  . Cancer Maternal Grandfather 15       prostate  . Cancer Paternal Grandfather 51       prostate     Allergies  Allergen Reactions  . Erythromycin Nausea Only    Current Outpatient Medications on File Prior to Visit  Medication Sig Dispense Refill  . atorvastatin (LIPITOR) 20 MG tablet TAKE 1 TABLET BY MOUTH EVERY DAY 90 tablet 0  . DULoxetine (CYMBALTA) 60 MG capsule TAKE 1 CAPSULE BY MOUTH EVERY DAY 90 capsule 1  . fenofibrate (TRICOR) 145 MG tablet Take 145 mg by mouth daily.     No current facility-administered medications on file prior to visit.    BP 125/85 (BP Location: Right Arm, Patient Position: Sitting, Cuff Size: Small)   Pulse 66   Temp 97.8 F (36.6 C) (Temporal)   Resp 16   Ht 5\' 9"  (1.753 m)   Wt 192 lb (87.1 kg)   BMI 28.35 kg/m       Objective:   Physical Exam Physical Exam  Constitutional: He is oriented to person, place, and time. He appears well-developed and well-nourished. No distress.  HENT:  Head: Normocephalic and atraumatic.  Right Ear: Tympanic membrane and ear canal normal.  Left Ear: Tympanic membrane and ear canal normal.  Mouth/Throat: Not examined, pt wearing mask Eyes: Pupils are equal, round, and reactive to light. No scleral icterus.  Neck: Normal range of motion. No thyromegaly present.  Cardiovascular: Normal rate and regular rhythm.   No murmur heard. Pulmonary/Chest: Effort normal and breath sounds normal. No respiratory distress. He has no wheezes. He has no rales. He exhibits no tenderness.  Abdominal: Soft. Bowel sounds are normal. He exhibits no distension and no mass. There is no tenderness. There is no rebound and no guarding.  Musculoskeletal: He  exhibits no edema.  Lymphadenopathy:    He has no cervical adenopathy.  Neurological: He is alert and oriented to person, place, and time. He has normal patellar reflexes. He exhibits normal muscle tone. Coordination normal.  Skin: Skin is warm and dry.  Psychiatric: He has a normal mood and affect. His behavior is normal. Judgment and thought content normal.           Assessment & Plan:   Preventative care- encouraged pt to continue to continue to work on healthy diet, exercise, and modest weight loss.  I recommended that he try to aim for 175 pounds.  Immunizations are reviewed  and are up-to-date.  Hyperlipidemia/hypertriglyceridemia-follow-up lipid panel is obtained and is improved.  Continue statin and fenofibrate.  ADD-new diagnosis for the patient.  This is currently being followed by attention specialist.  Depression-reports mood is stable on Cymbalta.  Will continue same.  This visit occurred during the SARS-CoV-2 public health emergency.  Safety protocols were in place, including screening questions prior to the visit, additional usage of staff PPE, and extensive cleaning of exam room while observing appropriate contact time as indicated for disinfecting solutions.          Assessment & Plan:

## 2019-11-25 ENCOUNTER — Other Ambulatory Visit: Payer: Self-pay | Admitting: Family

## 2019-12-21 ENCOUNTER — Other Ambulatory Visit: Payer: Self-pay | Admitting: Family

## 2020-02-24 ENCOUNTER — Other Ambulatory Visit: Payer: Self-pay | Admitting: Family

## 2020-03-08 ENCOUNTER — Other Ambulatory Visit: Payer: Self-pay | Admitting: Family

## 2020-04-18 ENCOUNTER — Telehealth: Payer: Self-pay | Admitting: Family

## 2020-04-18 ENCOUNTER — Other Ambulatory Visit: Payer: Self-pay

## 2020-04-18 ENCOUNTER — Encounter: Payer: Self-pay | Admitting: Family

## 2020-04-18 ENCOUNTER — Ambulatory Visit (INDEPENDENT_AMBULATORY_CARE_PROVIDER_SITE_OTHER): Payer: Self-pay | Admitting: Family

## 2020-04-18 VITALS — BP 119/80 | HR 73 | Temp 98.4°F | Resp 16 | Ht 69.0 in | Wt 196.0 lb

## 2020-04-18 DIAGNOSIS — F988 Other specified behavioral and emotional disorders with onset usually occurring in childhood and adolescence: Secondary | ICD-10-CM

## 2020-04-18 DIAGNOSIS — E785 Hyperlipidemia, unspecified: Secondary | ICD-10-CM

## 2020-04-18 DIAGNOSIS — Z23 Encounter for immunization: Secondary | ICD-10-CM

## 2020-04-18 DIAGNOSIS — F32A Depression, unspecified: Secondary | ICD-10-CM

## 2020-04-18 DIAGNOSIS — E1169 Type 2 diabetes mellitus with other specified complication: Secondary | ICD-10-CM

## 2020-04-18 MED ORDER — ATORVASTATIN CALCIUM 20 MG PO TABS: 20.0000 mg | ORAL_TABLET | Freq: Every day | ORAL | 1 refills | Status: DC

## 2020-04-18 NOTE — Telephone Encounter (Signed)
Pt dropped off copy of Covid Vaccination card to have on pt's chart. Document put at front office tray under providers name.

## 2020-04-18 NOTE — Progress Notes (Signed)
Subjective:    Patient ID: Brad Hill, male    DOB: 12/09/1975, 44 y.o.   MRN: [id number]  HPI  Patient is a 44 yr old male who presents today for routine follow up.  Depression- maintained on cymbalta. Denies side effects.  Overall feels that it is very helpful and that depression is controlled. Notes that he has had a great deal of stress.  Car transmission broke and he had to buy a new vehicle unexpectedly, he continues a difficult custody battle with his ex-wife for his two older children, wife had gallbladder surgery.  He is hopeful that things are going to settle down in the near future.   Hyperlipidemia- maintained on fenofibrate and lipitor.    Lab Results  Component Value Date   CHOL 169 10/18/2019   HDL 38.40 (L) 10/18/2019   LDLCALC 100 (H) 09/18/2015   LDLDIRECT 92.0 10/18/2019   TRIG 234.0 (H) 10/18/2019   CHOLHDL 4 10/18/2019   ADHD- following with [facility]. He is on Adderall xr 20mg  in the AM and 10mg  regular release in the PM.  Just started yesterday (previously on vyvanse which was too expensive).   He has completed the covid series.    Review of Systems Past Medical History:  Diagnosis Date  . ADD (attention deficit disorder) 10/18/2019  . Depression   . Pneumonia      Social History   Socioeconomic History  . Marital status: Married    Spouse name: Not on file  . Number of children: Not on file  . Years of education: Not on file  . Highest education level: Not on file  Occupational History  . Not on file  Tobacco Use  . Smoking status: Never Smoker  . Smokeless tobacco: Never Used  Substance and Sexual Activity  . Alcohol use: Yes    Alcohol/week: 0.0 standard drinks    Comment: occasional "less than once a week"  . Drug use: No  . Sexual activity: Yes    Partners: Female  Other Topics Concern  . 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    Grew up in [place], [place]   Enjoys sports, exercises some, spending time with family   Completed masters degree (divinity/theology)   Social Determinants of [place] Strain: Not on [place] Insecurity: Not on file  Transportation Needs: Not on file  Physical Activity: Not on file  Stress: Not on file  Social Connections: Not on file  Intimate Partner Violence: Not on file    Past Surgical History:  Procedure Laterality Date  . UMBILICAL HERNIA REPAIR  1/16  . WISDOM TOOTH EXTRACTION      Family History  Problem Relation Age of Onset  . Hypertension Mother   . Cancer Father 28       prostate- living, s/p surgery  . Cancer Maternal Grandfather 16       prostate  . Cancer Paternal Grandfather 41       prostate   . Cleft palate Son        leg length discrepency    Allergies  Allergen Reactions  . Erythromycin Nausea Only    Current Outpatient Medications on File Prior to Visit  Medication Sig Dispense Refill  . amphetamine-dextroamphetamine (ADDERALL  XR) 20 MG 24 hr capsule Take 20 mg by mouth every morning.    Marland Kitchen amphetamine-dextroamphetamine (ADDERALL) 10 MG tablet Take 10 mg by mouth 1 day or 1 dose.    Marland Kitchen atorvastatin (LIPITOR) 20 MG tablet Take 1 tablet (20 mg total) by mouth daily. 90 tablet 0  . DULoxetine (CYMBALTA) 60 MG capsule TAKE 1 CAPSULE BY MOUTH EVERY DAY 90 capsule 1  . fenofibrate (TRICOR) 145 MG tablet TAKE 1 TABLET BY MOUTH EVERY DAY 90 tablet 1   No current facility-administered medications on file prior to visit.    BP 119/80 (BP Location: Right Arm, Patient Position: Sitting, Cuff Size: Small)   Pulse 73   Temp 98.4 F (36.9 C) (Oral)   Resp 16   Ht 5\' 9"  (1.753 m)   Wt 196 lb (88.9 kg)   SpO2 97%   BMI 28.94 kg/m       Objective:   Physical Exam Constitutional:      General: He is not in acute distress.    Appearance:  He is well-developed and well-nourished.  HENT:     Head: Normocephalic and atraumatic.  Cardiovascular:     Rate and Rhythm: Normal rate and regular rhythm.     Heart sounds: No murmur heard.   Pulmonary:     Effort: Pulmonary effort is normal. No respiratory distress.     Breath sounds: Normal breath sounds. No wheezing or rales.  Musculoskeletal:        General: No edema.  Skin:    General: Skin is warm and dry.  Neurological:     Mental Status: He is alert and oriented to person, place, and time.  Psychiatric:        Mood and Affect: Mood and affect normal.        Behavior: Behavior normal.        Thought Content: Thought content normal.           Assessment & Plan:  Hyperlipidemia- follow up lipid panel was improved. Continue fenofibrate and lipitor 20mg . Reinforced diet/exercise/weight loss.  Depression- stable on cymbalta 60mg .  Continue same.  ADHD- stable. Management per [facility].  Flu shot today.   This visit occurred during the SARS-CoV-2 public health emergency.  Safety protocols were in place, including screening questions prior to the visit, additional usage of staff PPE, and extensive cleaning of exam room while observing appropriate contact time as indicated for disinfecting solutions.

## 2020-05-29 ENCOUNTER — Encounter: Payer: Self-pay | Admitting: Family

## 2020-05-29 MED ORDER — DULOXETINE HCL 60 MG PO CPEP: 60.0000 mg | ORAL_CAPSULE | Freq: Every day | ORAL | 1 refills | Status: DC

## 2020-05-29 MED ORDER — ATORVASTATIN CALCIUM 20 MG PO TABS: 20.0000 mg | ORAL_TABLET | Freq: Every day | ORAL | 1 refills | Status: DC

## 2020-05-29 MED ORDER — FENOFIBRATE 145 MG PO TABS: 145.0000 mg | ORAL_TABLET | Freq: Every day | ORAL | 1 refills | Status: DC

## 2020-10-24 ENCOUNTER — Encounter: Payer: Self-pay | Admitting: Family

## 2020-10-25 ENCOUNTER — Encounter: Payer: Self-pay | Admitting: Family

## 2020-11-02 IMAGING — DX DG CHEST 2V
2 series · 2 of 2 positions shown · non-contrast
Comparison: 07/01/2016

CLINICAL DATA: Cough and congestion

EXAM:
CHEST - 2 VIEW

[chest pa]
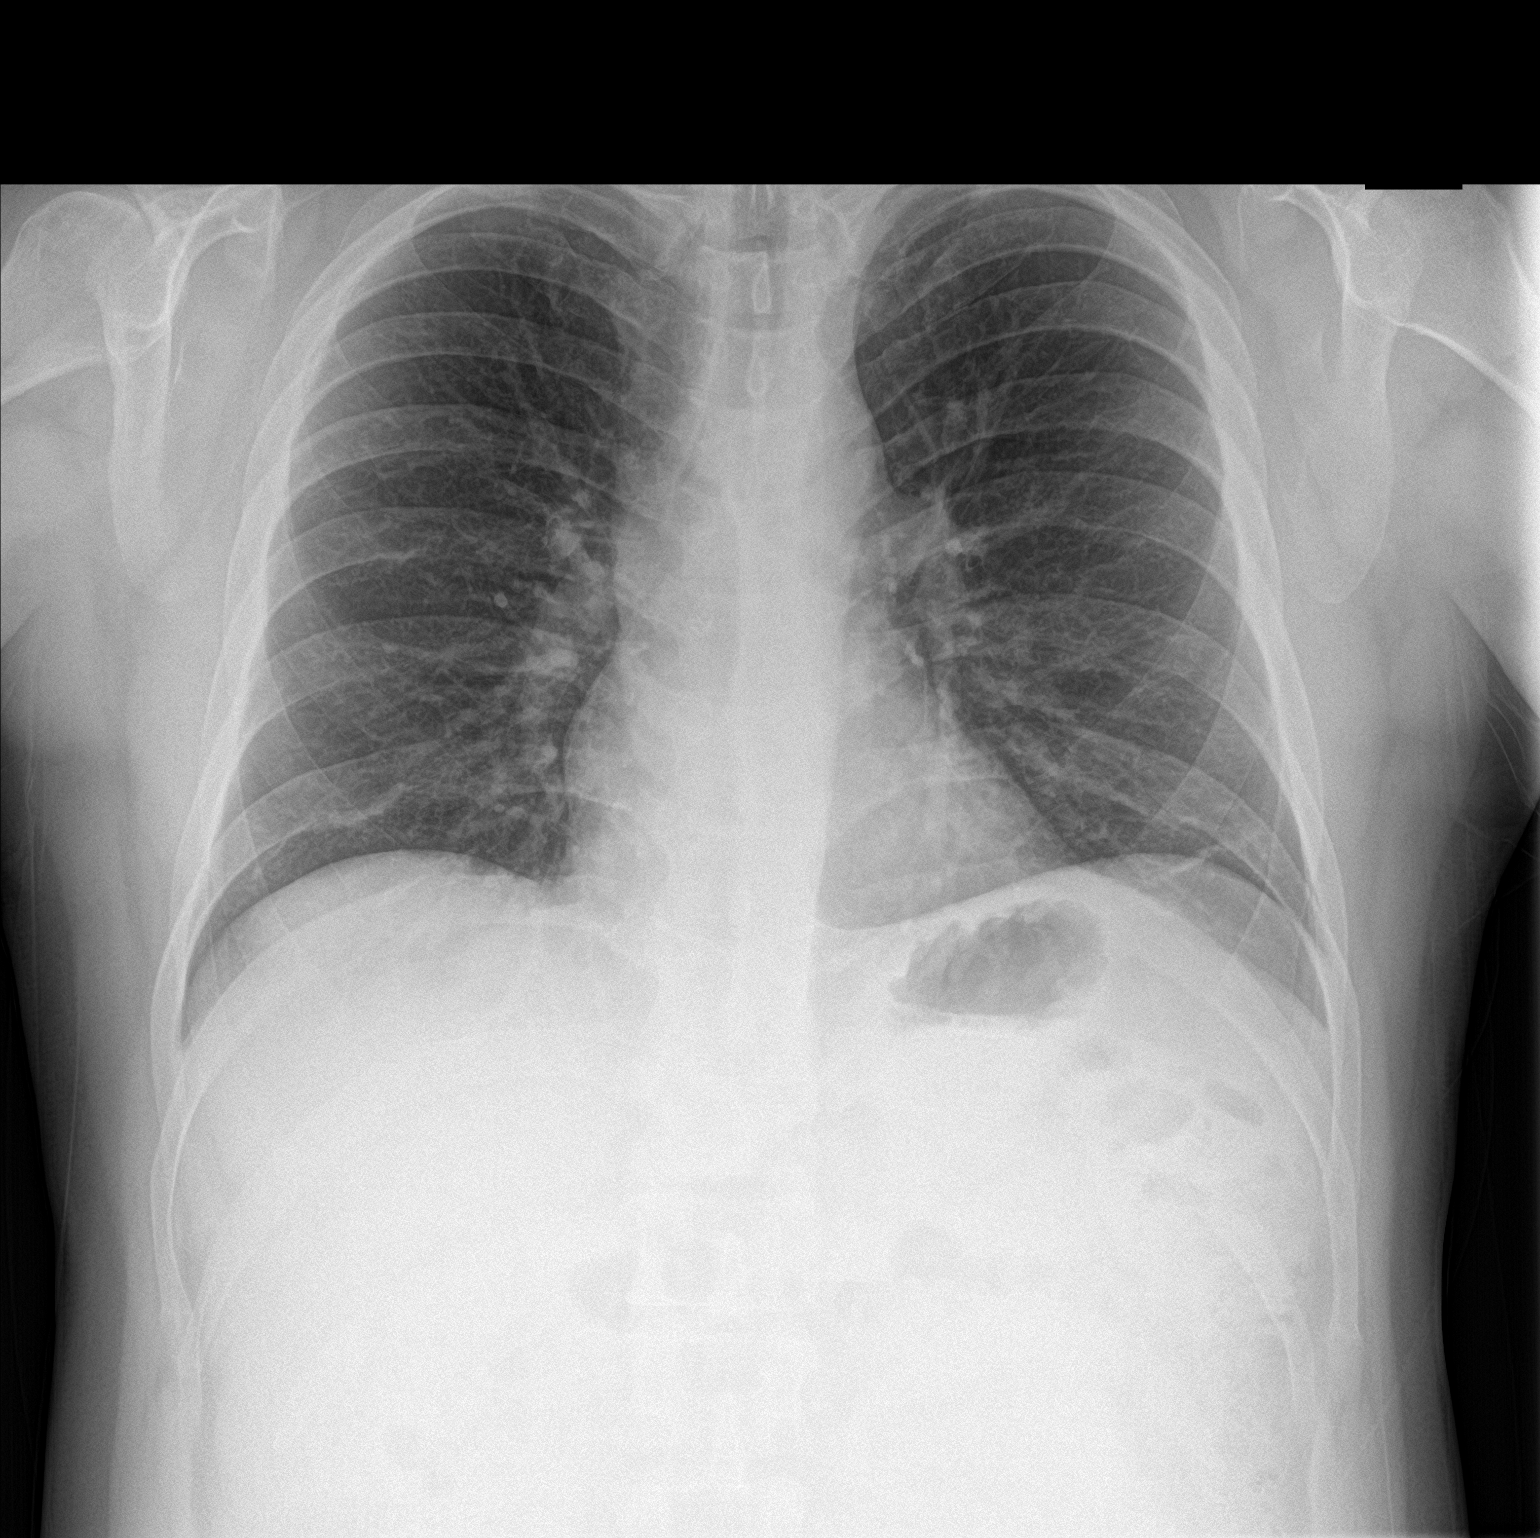

[chest lat]
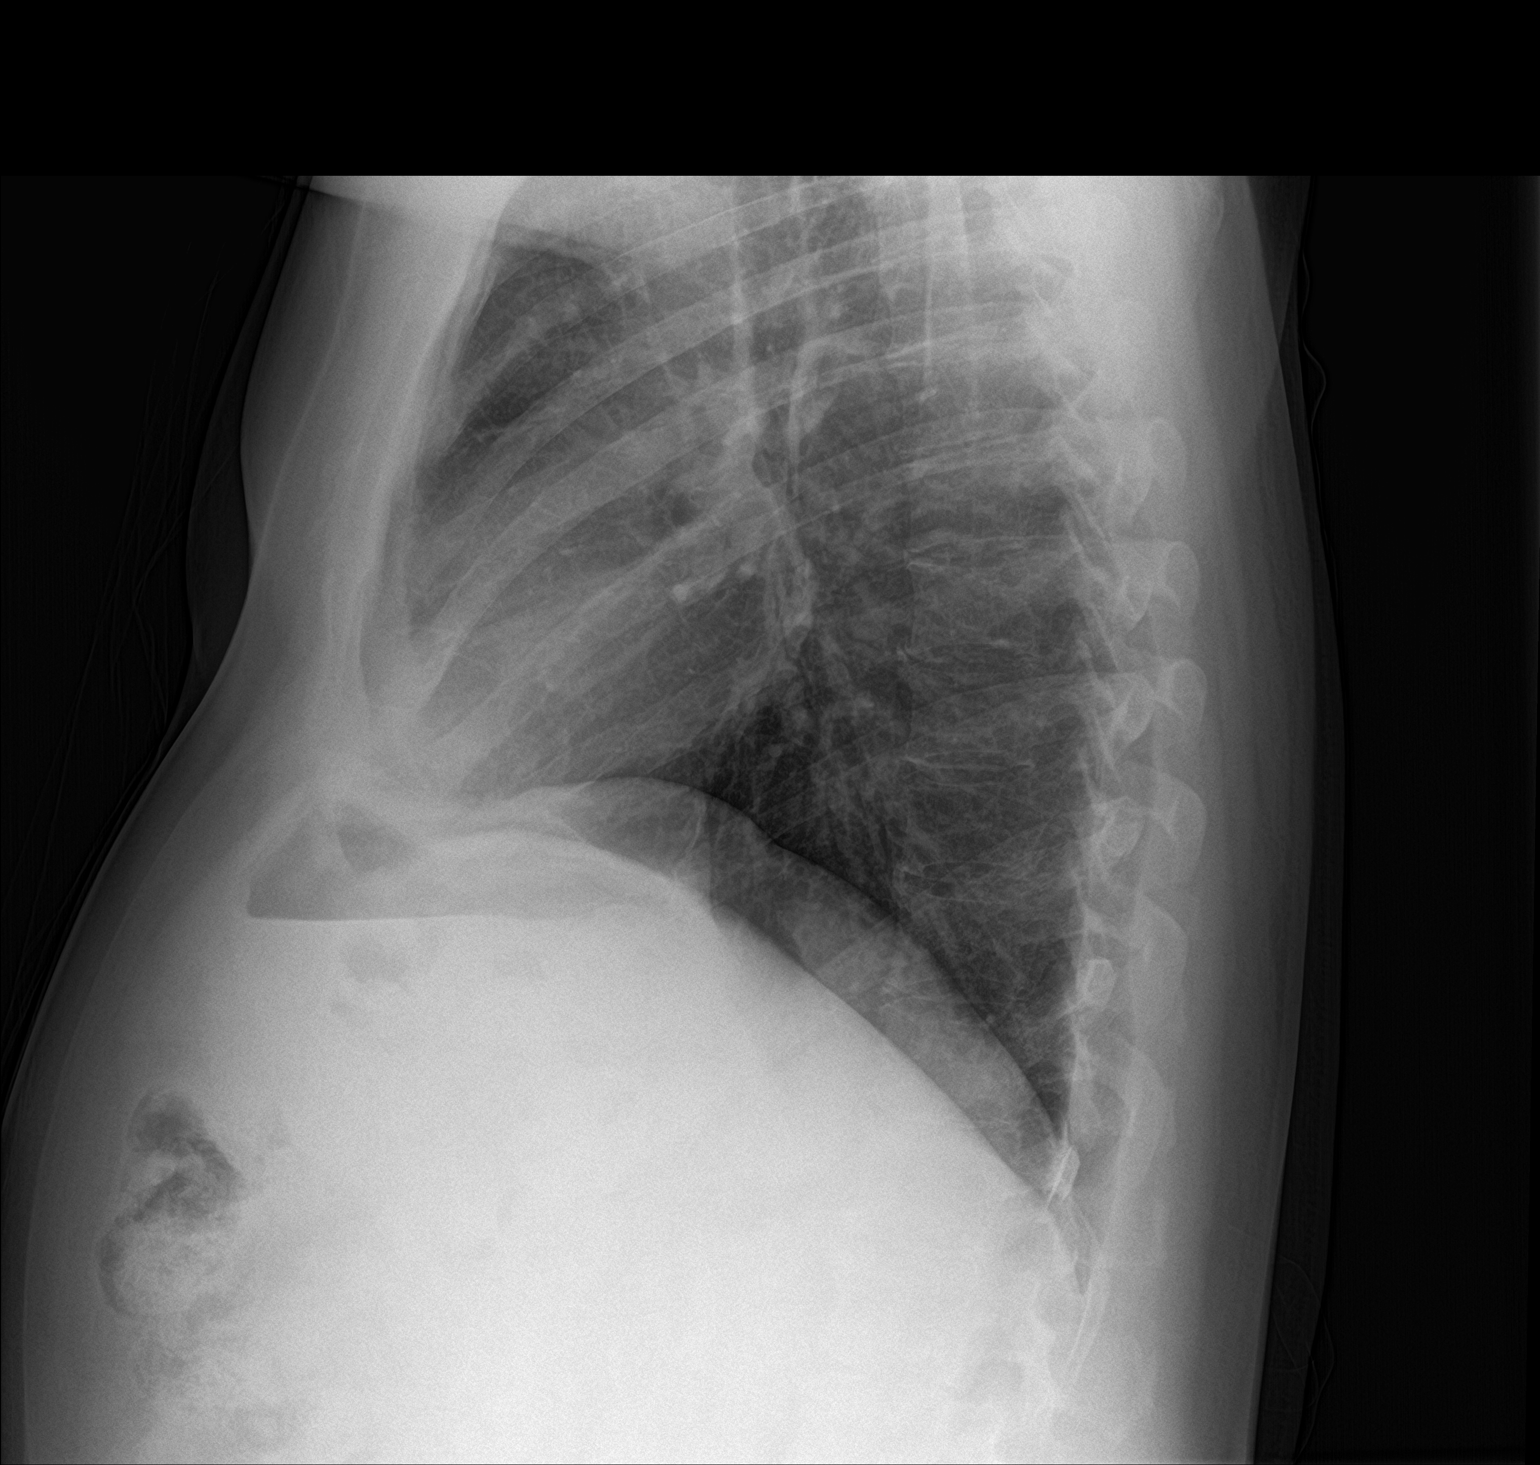

[2 of 2 positions shown; findings below may reference images not displayed]

FINDINGS: Interval clearing of patchy bibasilar airspace disease. Minimal
scarring remains in the left lung base. No acute infiltrate or
effusion. Negative for heart failure or edema. Heart size within
normal limits.
IMPRESSION: No active cardiopulmonary disease.

## 2021-01-22 ENCOUNTER — Encounter: Payer: Self-pay | Admitting: Family

## 2021-01-22 MED ORDER — ATORVASTATIN CALCIUM 20 MG PO TABS: 20.0000 mg | ORAL_TABLET | Freq: Every day | ORAL | 0 refills | Status: DC

## 2021-01-22 MED ORDER — FENOFIBRATE 145 MG PO TABS: 145.0000 mg | ORAL_TABLET | Freq: Every day | ORAL | 0 refills | Status: DC

## 2021-01-22 MED ORDER — DULOXETINE HCL 60 MG PO CPEP: 60.0000 mg | ORAL_CAPSULE | Freq: Every day | ORAL | 0 refills | Status: DC

## 2021-01-24 ENCOUNTER — Ambulatory Visit (INDEPENDENT_AMBULATORY_CARE_PROVIDER_SITE_OTHER): Payer: Self-pay | Admitting: Family

## 2021-01-24 ENCOUNTER — Encounter: Payer: Self-pay | Admitting: Family

## 2021-01-24 ENCOUNTER — Other Ambulatory Visit: Payer: Self-pay

## 2021-01-24 VITALS — BP 141/77 | HR 73 | Temp 97.8°F | Resp 16 | Ht 69.0 in | Wt 198.0 lb

## 2021-01-24 DIAGNOSIS — Z8379 Family history of other diseases of the digestive system: Secondary | ICD-10-CM

## 2021-01-24 DIAGNOSIS — Z1211 Encounter for screening for malignant neoplasm of colon: Secondary | ICD-10-CM

## 2021-01-24 DIAGNOSIS — F32A Depression, unspecified: Secondary | ICD-10-CM

## 2021-01-24 DIAGNOSIS — G2581 Restless legs syndrome: Secondary | ICD-10-CM

## 2021-01-24 DIAGNOSIS — F988 Other specified behavioral and emotional disorders with onset usually occurring in childhood and adolescence: Secondary | ICD-10-CM

## 2021-01-24 DIAGNOSIS — E781 Pure hyperglyceridemia: Secondary | ICD-10-CM

## 2021-01-24 DIAGNOSIS — Z23 Encounter for immunization: Secondary | ICD-10-CM

## 2021-01-24 DIAGNOSIS — Z0000 Encounter for general adult medical examination without abnormal findings: Secondary | ICD-10-CM

## 2021-01-24 LAB — COMPREHENSIVE METABOLIC PANEL
ALT: 24 U/L (ref 0–53)
AST: 26 U/L (ref 0–37)
Albumin: 4.7 g/dL (ref 3.5–5.2)
Alkaline Phosphatase: 67 U/L (ref 39–117)
BUN: 17 mg/dL (ref 6–23)
CO2: 29 mEq/L (ref 19–32)
Calcium: 9.9 mg/dL (ref 8.4–10.5)
Chloride: 101 mEq/L (ref 96–112)
Creatinine, Ser: 1.03 mg/dL (ref 0.40–1.50)
GFR: 87.37 mL/min (ref >60.00)
Glucose, Bld: 86 mg/dL (ref 70–99)
Potassium: 3.7 mEq/L (ref 3.5–5.1)
Sodium: 138 mEq/L (ref 135–145)
Total Bilirubin: 0.6 mg/dL (ref 0.2–1.2)
Total Protein: 7 g/dL (ref 6.0–8.3)

## 2021-01-24 LAB — LIPID PANEL
Cholesterol: 176 mg/dL (ref 0–200)
HDL: 37.8 mg/dL — ABNORMAL LOW (ref >39.00)
NonHDL: 138.53
Total CHOL/HDL Ratio: 5
Triglycerides: 331 mg/dL — ABNORMAL HIGH (ref 0.0–149.0)
VLDL: 66.2 mg/dL — ABNORMAL HIGH (ref 0.0–40.0)

## 2021-01-24 LAB — LDL CHOLESTEROL, DIRECT: Direct LDL: 105 mg/dL

## 2021-01-24 NOTE — Progress Notes (Signed)
Subjective:   By signing my name below, I, Brad Hill, attest that this documentation has been prepared under the direction and in the presence of [name] NP. 01/24/2021    Patient ID: Brad Hill, male    DOB: 04-28-1976, 45 y.o.   MRN: [id number]  Chief Complaint  Patient presents with   Annual Exam    HPI Patient is in today for a comprehensive physical exam.  She denies having any unexpected weight change, ear pain, hearing loss and rhinorrhea, visual disturbance, cough, chest pain and leg swelling, nausea, vomiting, diarrhea and blood in stool, or dysuria and frequency, for myalgias and arthralgias, rash, headaches, adenopathy, depression or anxiety at this time.  Social history- He has no recent surgical procedures in the past year. He reports his son has digestion issues and is getting screened for celiacs disease, otherwise there is no recent changes to his family medical history. His sons pediatric provider is requesting he gets genetically tested to see if he is prone to celiac disease.  Restless leg- He reports having occasional restless leg at night while laying in bed. He is taking OTC supplements and reports doing well while taking it.  Mood- He has stress in his daily life. He continues having depression and is taking 60 mg Cymbalta daily PO to manage it.  He also reports feeling like he has brain fog and lacks motivation to complete basic tasks. He also continues taking adderall to manage his anxiety. He is interested in starting counseling to manage his symptoms.   Immunizations: He is UTD on tetanus vaccines. He has 3 moderna Covid-19 vaccines. He was informed of the new Covid-19 booster vaccine. He is interested in getting the flu vaccine during this visit. Diet: He is has recently started a new diet. Prior to that he was not maintaining a healthy diet.  Exercise: He is participating in exercise by walking daily.  Colonoscopy: Not yet completed. Due. He is  interested in setting up an appointment to get one completed.  Dental: He is UTD on dental care. Vision: He is UTD on vision care.    Health Maintenance Due  Topic Date Due   URINE MICROALBUMIN  Never done   HIV Screening  Never done   Hepatitis C Screening  Never done   COLONOSCOPY (Pts 45-40yr Insurance coverage will need to be confirmed)  Never done    Past Medical History:  Diagnosis Date   ADD (attention deficit disorder) 10/18/2019   Depression    Pneumonia     Past Surgical History:  Procedure Laterality Date   UMBILICAL HERNIA REPAIR  1/16   WISDOM TOOTH EXTRACTION      Family History  Problem Relation Age of Onset   Hypertension Mother    Cancer Father 554      prostate- living, s/p surgery   Cancer Maternal Grandfather [phone]      prostate   Cancer Paternal Grandfather [phone]      prostate    Cleft palate Son        leg length discrepency    Social History   Socioeconomic History   Marital status: Married    Spouse name: Not on file   Number of children: Not on file   Years of education: Not on file   Highest education level: Not on file  Occupational History   Not on file  Tobacco Use   Smoking status: Never   Smokeless tobacco: Never  Substance and  Sexual Activity   Alcohol use: Yes    Alcohol/week: 0.0 standard drinks    Comment: occasional "less than once a week"   Drug use: No   Sexual activity: Yes    Partners: Female  Other Topics Concern   Not on file  Social History Narrative   Has two children ages 2011 [name](boy) and 19 sarah 2006(daughter), [name] 5/17- he has full legal custody   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    Grew up in [place], [place]   Enjoys sports, exercises some, spending time with family   Completed masters degree (divinity/theology)   Social Determinants of Radio broadcast assistant Strain: Not on Art therapist Insecurity: Not on file  Transportation Needs: Not on file   Physical Activity: Not on file  Stress: Not on file  Social Connections: Not on file  Intimate Partner Violence: Not on file    Outpatient Medications Prior to Visit  Medication Sig Dispense Refill   amphetamine-dextroamphetamine (ADDERALL XR) 20 MG 24 hr capsule Take 20 mg by mouth every morning.     amphetamine-dextroamphetamine (ADDERALL) 10 MG tablet Take 10 mg by mouth 1 day or 1 dose.     atorvastatin (LIPITOR) 20 MG tablet Take 1 tablet (20 mg total) by mouth daily. 30 tablet 0   DULoxetine (CYMBALTA) 60 MG capsule Take 1 capsule (60 mg total) by mouth daily. 30 capsule 0   fenofibrate (TRICOR) 145 MG tablet Take 1 tablet (145 mg total) by mouth daily. 30 tablet 0   No facility-administered medications prior to visit.    Allergies  Allergen Reactions   Erythromycin Nausea Only    Review of Systems  Constitutional:        (-)unexpected weight change (-)Adenopathy  HENT:  Negative for hearing loss.        (-)Rhinorrhea   Eyes:        (-)Visual disturbance  Respiratory:  Negative for cough.   Cardiovascular:  Negative for chest pain and leg swelling.  Gastrointestinal:  Negative for blood in stool, diarrhea, nausea and vomiting.  Genitourinary:  Negative for dysuria and frequency.  Musculoskeletal:  Negative for joint pain and myalgias.       (+)occasional restless leg syndrome   Skin:  Negative for rash.  Neurological:  Negative for headaches.  Psychiatric/Behavioral:  Negative for depression. The patient is not nervous/anxious.       Objective:    Physical Exam Constitutional:      General: He is not in acute distress.    Appearance: Normal appearance. He is not ill-appearing.  HENT:     Head: Normocephalic and atraumatic.     Right Ear: Tympanic membrane, ear canal and external ear normal.     Left Ear: Tympanic membrane, ear canal and external ear normal.  Eyes:     Extraocular Movements: Extraocular movements intact.     Pupils: Pupils are equal, round,  and reactive to light.     Comments: No nystagmus  Neck:     Thyroid: No thyromegaly or thyroid tenderness.  Cardiovascular:     Rate and Rhythm: Normal rate and regular rhythm.     Heart sounds: Normal heart sounds. No murmur heard.   No gallop.  Pulmonary:     Effort: Pulmonary effort is normal. No respiratory distress.     Breath sounds: Normal breath sounds. No wheezing or rales.  Abdominal:     General: Bowel  sounds are normal. There is no distension.     Palpations: Abdomen is soft.     Tenderness: There is no abdominal tenderness. There is no guarding.  Musculoskeletal:     Comments: 5/5 strength in both upper and lower extremities  Lymphadenopathy:     Cervical: No cervical adenopathy.  Skin:    General: Skin is warm and dry.  Neurological:     Mental Status: He is alert and oriented to person, place, and time.     Deep Tendon Reflexes:     Reflex Scores:      Patellar reflexes are 2+ on the right side and 2+ on the left side. Psychiatric:        Behavior: Behavior normal.        Judgment: Judgment normal.    BP (!) 141/77 (BP Location: Right Arm, Patient Position: Sitting, Cuff Size: Large)   Pulse 73   Temp 97.8 F (36.6 C) (Oral)   Resp 16   Ht 5' 9"  (1.753 m)   Wt 198 lb (89.8 kg)   SpO2 100%   BMI 29.24 kg/m  Wt Readings from Last 3 Encounters:  01/24/21 198 lb (89.8 kg)  04/18/20 196 lb (88.9 kg)  10/18/19 192 lb (87.1 kg)       Assessment & Plan:   Problem List Items Addressed This Visit       Unprioritized   RLS (restless legs syndrome)    New.  He declines rx at this time. Feels like he is managing with homeopathic treatment.       Preventative health care - Primary    Wt Readings from Last 3 Encounters:  01/24/21 198 lb (89.8 kg)  04/18/20 196 lb (88.9 kg)  10/18/19 192 lb (87.1 kg)  Encouraged pt to continue his work on [employer], exercise.  Tetanus up to date. Continue walking.        Hypertriglyceridemia   Relevant Orders    Comp Met (CMET) (Completed)   Lipid panel (Completed)   Family history of celiac disease   Relevant Orders   Celiac HLA Rflx to Abs   Depression    Having a lot of situational stressors surrounding custody of his older children.  Suggested that he seek counseling and gave him number to call.  Continue cymbalta 76m once daily.       ADD (attention deficit disorder)    Fair control. Management per [facility].      Other Visit Diagnoses     Needs flu shot       Relevant Orders   Flu Vaccine QUAD 6+ mos PF IM (Fluarix Quad PF) (Completed)   Colon cancer screening       Relevant Orders   Ambulatory referral to Gastroenterology        No orders of the defined types were placed in this encounter.   I, MDebbrah AlarNP, personally preformed the services described in this documentation.  All medical record entries made by the scribe were at my direction and in my presence.  I have reviewed the chart and discharge instructions (if applicable) and agree that the record reflects my personal performance and is accurate and complete. 01/24/2021   I,Brad Hill,acting as a scribe for MNance Pear NP.,have documented all relevant documentation on the behalf of MNance Pear NP,as directed by  MNance Pear NP while in the presence of MNance Pear NP.   MNance Pear NP

## 2021-01-24 NOTE — Assessment & Plan Note (Signed)
Wt Readings from Last 3 Encounters:  01/24/21 198 lb (89.8 kg)  04/18/20 196 lb (88.9 kg)  10/18/19 192 lb (87.1 kg)   Encouraged pt to continue his work on [employer], exercise.  Tetanus up to date. Continue walking.

## 2021-01-24 NOTE — Patient Instructions (Addendum)
[facility] [phone] (please call to schedule an appointment).

## 2021-01-25 ENCOUNTER — Telehealth: Payer: Self-pay | Admitting: Family

## 2021-01-25 DIAGNOSIS — G2581 Restless legs syndrome: Secondary | ICD-10-CM | POA: Insufficient documentation

## 2021-01-25 DIAGNOSIS — Z8379 Family history of other diseases of the digestive system: Secondary | ICD-10-CM | POA: Insufficient documentation

## 2021-01-25 NOTE — Telephone Encounter (Signed)
See mychart.  

## 2021-01-25 NOTE — Assessment & Plan Note (Signed)
Having a lot of situational stressors surrounding custody of his older children.  Suggested that he seek counseling and gave him number to call.  Continue cymbalta 60mg  once daily.

## 2021-01-25 NOTE — Assessment & Plan Note (Signed)
Fair control. Management per [facility].

## 2021-01-25 NOTE — Assessment & Plan Note (Signed)
New.  He declines rx at this time. Feels like he is managing with homeopathic treatment.

## 2021-02-19 ENCOUNTER — Other Ambulatory Visit: Payer: Self-pay | Admitting: Family

## 2021-07-23 ENCOUNTER — Ambulatory Visit (INDEPENDENT_AMBULATORY_CARE_PROVIDER_SITE_OTHER): Payer: Self-pay | Admitting: Family

## 2021-07-23 VITALS — BP 113/76 | HR 75 | Temp 98.7°F | Resp 16 | Wt 198.0 lb

## 2021-07-23 DIAGNOSIS — E785 Hyperlipidemia, unspecified: Secondary | ICD-10-CM

## 2021-07-23 DIAGNOSIS — F32A Depression, unspecified: Secondary | ICD-10-CM

## 2021-07-23 DIAGNOSIS — E781 Pure hyperglyceridemia: Secondary | ICD-10-CM

## 2021-07-23 DIAGNOSIS — Z1211 Encounter for screening for malignant neoplasm of colon: Secondary | ICD-10-CM

## 2021-07-23 DIAGNOSIS — Z8042 Family history of malignant neoplasm of prostate: Secondary | ICD-10-CM

## 2021-07-23 LAB — LIPID PANEL
Cholesterol: 178 mg/dL (ref 0–200)
HDL: 41.6 mg/dL (ref >39.00)
NonHDL: 136.58
Total CHOL/HDL Ratio: 4
Triglycerides: 210 mg/dL — ABNORMAL HIGH (ref 0.0–149.0)
VLDL: 42 mg/dL — ABNORMAL HIGH (ref 0.0–40.0)

## 2021-07-23 LAB — LDL CHOLESTEROL, DIRECT: Direct LDL: 122 mg/dL

## 2021-07-23 MED ORDER — DULOXETINE HCL 60 MG PO CPEP: 60.0000 mg | ORAL_CAPSULE | Freq: Every day | ORAL | 1 refills | Status: DC

## 2021-07-23 MED ORDER — FENOFIBRATE 145 MG PO TABS: 145.0000 mg | ORAL_TABLET | Freq: Every day | ORAL | 1 refills | Status: DC

## 2021-07-23 MED ORDER — ATORVASTATIN CALCIUM 20 MG PO TABS: 20.0000 mg | ORAL_TABLET | Freq: Every day | ORAL | 1 refills | Status: DC

## 2021-07-23 NOTE — Assessment & Plan Note (Signed)
Stable on current dose of cymbalta. He may wish to try to taper down/off at some point in the future, but will let me know when he is ready.  ?

## 2021-07-23 NOTE — Assessment & Plan Note (Signed)
Lab Results  ?Component Value Date  ? CHOL 176 01/24/2021  ? HDL 37.80 (L) 01/24/2021  ? LDLCALC 100 (H) 09/18/2015  ? LDLDIRECT 105.0 01/24/2021  ? TRIG 331.0 (H) 01/24/2021  ? CHOLHDL 5 01/24/2021  ? ? ?

## 2021-07-23 NOTE — Progress Notes (Signed)
? ?Subjective:  ? ?By signing my name below, I, Brad Hill, attest that this documentation has been prepared under the direction and in the presence of Brad Alar, NP. 07/23/2021 ?  ? ? Patient ID: Brad Hill, male    DOB: 05-11-1975, 46 y.o.   MRN: [id number] ? ?Chief Complaint  ?Patient presents with  ? Depression  ?  Here for follow up  ? Hypertension  ?  Here for follow up  ? ? ?HPI ?Patient is in today for a follow up visit.  ? ?ADHD/Depression- He started taking 25 mg Mydayis daily PO and reports doing well while taking it. He reports his other provider managing his ADHD switched him from adderall to [place] due to forgetting to take both doses. He continues taking 60 mg Cymbalta daily PO and reports doing well while taking it. He is managing stressors in his life well at this time.  ? ?Cholesterol- He continues taking 20 mg atorvastatin daily PO and 145 mg fenofibrate daily PO and reports no new issues while taking it. He is fasting at this time and is interested in completing lab work during this visit.  ?Lab Results  ?Component Value Date  ? CHOL 176 01/24/2021  ? HDL 37.80 (L) 01/24/2021  ? LDLCALC 100 (H) 09/18/2015  ? LDLDIRECT 105.0 01/24/2021  ? TRIG 331.0 (H) 01/24/2021  ? CHOLHDL 5 01/24/2021  ? ?Screening/Immunizations- He is not interested in HIV or hepatitis C screening. He has 3 Covid-19 vaccines at this time.  ? ?Exercise- He is planning on increasing his exercise daily. He walks around 30 minutes daily. He finds it difficult to find time to increase his amount of exercise.  ? ?Colonoscopy- He has not yet completed a colonoscopy and is interested in scheduling an appointment.  ? ?PSA- He reports his father having a history of prostate cancer at age 61. His paternal grandfather also has a history of prostate cancer. He is interested in checking his PSA levels during his next lab work.  ? ? ?Health Maintenance Due  ?Topic Date Due  ? URINE MICROALBUMIN  Never done  ? COLONOSCOPY (Pts  45-62yrs Insurance coverage will need to be confirmed)  Never done  ? COVID-19 Vaccine (4 - Booster for Moderna series) 05/16/2020  ? ? ?Past Medical History:  ?Diagnosis Date  ? ADD (attention deficit disorder) 10/18/2019  ? Depression   ? Pneumonia   ? ? ?Past Surgical History:  ?Procedure Laterality Date  ? UMBILICAL HERNIA REPAIR  1/16  ? WISDOM TOOTH EXTRACTION    ? ? ?Family History  ?Problem Relation Age of Onset  ? Hypertension Mother   ? Cancer Father 90  ?     prostate- living, s/p surgery  ? Cancer Maternal Grandfather 17  ?     prostate  ? Cancer Paternal Grandfather 89  ?     prostate   ? Cleft palate Son   ?     leg length discrepency  ? ? ?Social History  ? ?Socioeconomic History  ? Marital status: Married  ?  Spouse name: Not on file  ? Number of children: Not on file  ? Years of education: Not on file  ? Highest education level: Not on file  ?Occupational History  ? Not on file  ?Tobacco Use  ? Smoking status: Never  ? Smokeless tobacco: Never  ?Substance and Sexual Activity  ? Alcohol use: Yes  ?  Alcohol/week: 0.0 standard drinks  ?  Comment: occasional "less than  once a week"  ? Drug use: No  ? Sexual activity: Yes  ?  Partners: Female  ?Other Topics Concern  ? Not on file  ?Social History Narrative  ? Has two children ages 2011 [name](boy) and 14 sarah 2006(daughter), [name] 5/17- he has full legal custody  ? 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   ? [place] up in [place], [place]  ? Enjoys sports, exercises some, spending time with family  ? Completed masters degree (divinity/theology)  ? ?Social Determinants of Health  ? ?Financial Resource Strain: Not on file  ?Food Insecurity: Not on file  ?Transportation Needs: Not on file  ?Physical Activity: Not on file  ?Stress: Not on file  ?Social Connections: Not on file  ?Intimate Partner Violence: Not on file  ? ? ?Outpatient Medications Prior to Visit  ?Medication Sig Dispense Refill  ? Amphet-Dextroamphet 3-Bead ER  (MYDAYIS) 25 MG CP24 Take by mouth.    ? atorvastatin (LIPITOR) 20 MG tablet TAKE ONE TABLET BY MOUTH DAILY 90 tablet 1  ? DULoxetine (CYMBALTA) 60 MG capsule TAKE ONE CAPSULE BY MOUTH DAILY 90 capsule 1  ? fenofibrate (TRICOR) 145 MG tablet TAKE ONE TABLET BY MOUTH DAILY 90 tablet 1  ? amphetamine-dextroamphetamine (ADDERALL XR) 20 MG 24 hr capsule Take 20 mg by mouth every morning.    ? amphetamine-dextroamphetamine (ADDERALL) 10 MG tablet Take 10 mg by mouth 1 day or 1 dose.    ? ?No facility-administered medications prior to visit.  ? ? ?Allergies  ?Allergen Reactions  ? Erythromycin Nausea Only  ? ? ?ROS ? ?   ?Objective:  ?  ?Physical Exam ?Constitutional:   ?   General: He is not in acute distress. ?   Appearance: Normal appearance. He is not ill-appearing.  ?HENT:  ?   Head: Normocephalic and atraumatic.  ?   Right Ear: External ear normal.  ?   Left Ear: External ear normal.  ?Eyes:  ?   Extraocular Movements: Extraocular movements intact.  ?   Pupils: Pupils are equal, round, and reactive to light.  ?Cardiovascular:  ?   Rate and Rhythm: Normal rate and regular rhythm.  ?   Heart sounds: Normal heart sounds. No murmur heard. ?  No gallop.  ?Pulmonary:  ?   Effort: Pulmonary effort is normal. No respiratory distress.  ?   Breath sounds: Normal breath sounds. No wheezing or rales.  ?Skin: ?   General: Skin is warm and dry.  ?Neurological:  ?   Mental Status: He is alert and oriented to person, place, and time.  ?Psychiatric:     ?   Judgment: Judgment normal.  ? ? ?BP 113/76 (BP Location: Right Arm, Patient Position: Sitting, Cuff Size: Large)   Pulse 75   Temp 98.7 ?F (37.1 ?C) (Oral)   Resp 16   Wt 198 lb (89.8 kg)   SpO2 98%   BMI 29.24 kg/m?  ?Wt Readings from Last 3 Encounters:  ?07/23/21 198 lb (89.8 kg)  ?01/24/21 198 lb (89.8 kg)  ?04/18/20 196 lb (88.9 kg)  ? ? ?   ?Assessment & Plan:  ? ?Problem List Items Addressed This Visit   ? ?  ? Unprioritized  ? Hypertriglyceridemia  ?  Lab Results   ?Component Value Date  ? CHOL 176 01/24/2021  ? HDL 37.80 (L) 01/24/2021  ? LDLCALC 100 (H) 09/18/2015  ? LDLDIRECT 105.0 01/24/2021  ? TRIG 331.0 (H) 01/24/2021  ?  CHOLHDL 5 01/24/2021  ? ?  ?  ? Relevant Medications  ? atorvastatin (LIPITOR) 20 MG tablet  ? fenofibrate (TRICOR) 145 MG tablet  ? Depression  ?  Stable on current dose of cymbalta. He may wish to try to taper down/off at some point in the future, but will let me know when he is ready.  ?  ?  ? Relevant Medications  ? DULoxetine (CYMBALTA) 60 MG capsule  ? ?Other Visit Diagnoses   ? ? Hyperlipidemia, unspecified hyperlipidemia type    -  Primary  ? Relevant Medications  ? atorvastatin (LIPITOR) 20 MG tablet  ? fenofibrate (TRICOR) 145 MG tablet  ? Other Relevant Orders  ? Lipid panel  ? Colon cancer screening      ? Relevant Orders  ? Ambulatory referral to Gastroenterology  ? Family hx of prostate cancer      ? Relevant Orders  ? PSA  ? ?  ? ? ? ?Meds ordered this encounter  ?Medications  ? atorvastatin (LIPITOR) 20 MG tablet  ?  Sig: Take 1 tablet (20 mg total) by mouth daily.  ?  Dispense:  90 tablet  ?  Refill:  1  ?  Order Specific Question:   Supervising Provider  ?  Answer:   Penni Homans A [id number]  ? fenofibrate (TRICOR) 145 MG tablet  ?  Sig: Take 1 tablet (145 mg total) by mouth daily.  ?  Dispense:  90 tablet  ?  Refill:  1  ?  Order Specific Question:   Supervising Provider  ?  Answer:   Penni Homans A [id number]  ? DULoxetine (CYMBALTA) 60 MG capsule  ?  Sig: Take 1 capsule (60 mg total) by mouth daily.  ?  Dispense:  90 capsule  ?  Refill:  1  ?  Order Specific Question:   Supervising Provider  ?  Answer:   Penni Homans A [id number]  ? ? ?I, Nance Pear, NP, personally preformed the services described in this documentation.  All medical record entries made by the scribe were at my direction and in my presence.  I have reviewed the chart and discharge instructions (if applicable) and agree that the record reflects my personal  performance and is accurate and complete. 07/23/2021 ? ? ?Engineering geologist as a Education administrator for [employer], NP.,have documented all relevant documentation on the behalf of Nance Pear, NP,as directed b

## 2021-07-23 NOTE — Patient Instructions (Signed)
Please complete lab work prior to leaving.   

## 2021-07-24 LAB — PSA: PSA: 0.66 ng/mL (ref 0.10–4.00)

## 2021-09-04 ENCOUNTER — Encounter: Payer: Self-pay | Admitting: Gastroenterology

## 2021-09-27 ENCOUNTER — Telehealth: Payer: Self-pay | Admitting: *Deleted

## 2021-09-27 NOTE — Telephone Encounter (Signed)
Patient was rescheduled for 6/6 at 1:30

## 2021-09-27 NOTE — Telephone Encounter (Signed)
Patient no showed PV today at 830 am  -Called patient and left message to return call at 829 am, 833 am and 840 am  LM to return call by 5 pm today to RS PV - LM If no call by 5 pm, PV and procedure will be canceled - MArie PV   .

## 2021-10-02 ENCOUNTER — Telehealth: Payer: Self-pay | Admitting: *Deleted

## 2021-10-02 NOTE — Telephone Encounter (Signed)
Called pt, no answer left message. No show letter mail. Colon and PV cx.

## 2021-10-02 NOTE — Telephone Encounter (Signed)
Patient no show/answer PV appointment for today. I called patient x2, no answer, left a message for the patient to call us back today before 5 pm or the PV and procedure will be cancelled. ? ?

## 2021-10-16 ENCOUNTER — Encounter: Payer: Medicaid Other | Admitting: Gastroenterology

## 2022-02-11 ENCOUNTER — Encounter: Payer: No Typology Code available for payment source | Admitting: Family

## 2022-04-02 ENCOUNTER — Encounter: Payer: Self-pay | Admitting: Family

## 2022-04-02 MED ORDER — FENOFIBRATE 145 MG PO TABS: 145.0000 mg | ORAL_TABLET | Freq: Every day | ORAL | 0 refills | Status: DC

## 2022-04-02 MED ORDER — ATORVASTATIN CALCIUM 20 MG PO TABS: 20.0000 mg | ORAL_TABLET | Freq: Every day | ORAL | 0 refills | Status: DC

## 2022-04-12 ENCOUNTER — Other Ambulatory Visit: Payer: Self-pay | Admitting: Family

## 2022-04-30 ENCOUNTER — Other Ambulatory Visit: Payer: Self-pay | Admitting: Family

## 2022-06-28 DIAGNOSIS — Z419 Encounter for procedure for purposes other than remedying health state, unspecified: Secondary | ICD-10-CM | POA: Diagnosis not present

## 2022-07-04 ENCOUNTER — Other Ambulatory Visit: Payer: Self-pay | Admitting: Family

## 2022-07-29 DIAGNOSIS — Z419 Encounter for procedure for purposes other than remedying health state, unspecified: Secondary | ICD-10-CM | POA: Diagnosis not present

## 2022-08-06 ENCOUNTER — Other Ambulatory Visit: Payer: Self-pay | Admitting: Family

## 2022-08-06 NOTE — Telephone Encounter (Signed)
Called and left detail message for patient to call and if he has a new provider give [place] the information for our records. If no new provider, scheduled due follow up appointment with Melissa.

## 2022-08-06 NOTE — Telephone Encounter (Signed)
I sent a 30 day refill of his cymbalta.  I believe he moved.  Is he seeing another provider?  If not, please schedule him a follow up with me.

## 2022-08-28 DIAGNOSIS — Z419 Encounter for procedure for purposes other than remedying health state, unspecified: Secondary | ICD-10-CM | POA: Diagnosis not present

## 2022-09-07 ENCOUNTER — Other Ambulatory Visit: Payer: Self-pay | Admitting: Family

## 2022-09-07 ENCOUNTER — Telehealth: Payer: Self-pay | Admitting: Family

## 2022-09-07 NOTE — Telephone Encounter (Signed)
See mychart.  

## 2022-09-28 DIAGNOSIS — Z419 Encounter for procedure for purposes other than remedying health state, unspecified: Secondary | ICD-10-CM | POA: Diagnosis not present

## 2022-10-02 DIAGNOSIS — J069 Acute upper respiratory infection, unspecified: Secondary | ICD-10-CM | POA: Diagnosis not present

## 2022-10-28 DIAGNOSIS — F321 Major depressive disorder, single episode, moderate: Secondary | ICD-10-CM | POA: Diagnosis not present

## 2022-10-28 DIAGNOSIS — E782 Mixed hyperlipidemia: Secondary | ICD-10-CM | POA: Diagnosis not present

## 2022-10-28 DIAGNOSIS — Z419 Encounter for procedure for purposes other than remedying health state, unspecified: Secondary | ICD-10-CM | POA: Diagnosis not present

## 2022-11-06 ENCOUNTER — Other Ambulatory Visit: Payer: Self-pay | Admitting: Family

## 2022-11-28 DIAGNOSIS — Z419 Encounter for procedure for purposes other than remedying health state, unspecified: Secondary | ICD-10-CM | POA: Diagnosis not present

## 2022-12-29 DIAGNOSIS — Z419 Encounter for procedure for purposes other than remedying health state, unspecified: Secondary | ICD-10-CM | POA: Diagnosis not present

## 2023-01-20 ENCOUNTER — Other Ambulatory Visit: Payer: Self-pay | Admitting: Family

## 2023-01-20 DIAGNOSIS — Z23 Encounter for immunization: Secondary | ICD-10-CM | POA: Diagnosis not present

## 2023-01-27 ENCOUNTER — Other Ambulatory Visit: Payer: Self-pay | Admitting: Family

## 2023-01-28 DIAGNOSIS — Z419 Encounter for procedure for purposes other than remedying health state, unspecified: Secondary | ICD-10-CM | POA: Diagnosis not present

## 2023-01-30 ENCOUNTER — Other Ambulatory Visit: Payer: Self-pay | Admitting: Family

## 2023-02-28 DIAGNOSIS — Z419 Encounter for procedure for purposes other than remedying health state, unspecified: Secondary | ICD-10-CM | POA: Diagnosis not present

## 2023-03-30 DIAGNOSIS — Z419 Encounter for procedure for purposes other than remedying health state, unspecified: Secondary | ICD-10-CM | POA: Diagnosis not present

## 2023-04-12 DIAGNOSIS — B9689 Other specified bacterial agents as the cause of diseases classified elsewhere: Secondary | ICD-10-CM | POA: Diagnosis not present

## 2023-04-12 DIAGNOSIS — J208 Acute bronchitis due to other specified organisms: Secondary | ICD-10-CM | POA: Diagnosis not present

## 2023-04-30 DIAGNOSIS — Z419 Encounter for procedure for purposes other than remedying health state, unspecified: Secondary | ICD-10-CM | POA: Diagnosis not present

## 2023-05-07 DIAGNOSIS — Z0000 Encounter for general adult medical examination without abnormal findings: Secondary | ICD-10-CM | POA: Diagnosis not present

## 2023-05-07 DIAGNOSIS — Z789 Other specified health status: Secondary | ICD-10-CM | POA: Diagnosis not present

## 2023-05-07 DIAGNOSIS — E782 Mixed hyperlipidemia: Secondary | ICD-10-CM | POA: Diagnosis not present

## 2023-05-07 DIAGNOSIS — Z23 Encounter for immunization: Secondary | ICD-10-CM | POA: Diagnosis not present

## 2023-05-07 DIAGNOSIS — Z6827 Body mass index (BMI) 27.0-27.9, adult: Secondary | ICD-10-CM | POA: Diagnosis not present

## 2023-05-07 DIAGNOSIS — F321 Major depressive disorder, single episode, moderate: Secondary | ICD-10-CM | POA: Diagnosis not present

## 2023-05-07 DIAGNOSIS — F900 Attention-deficit hyperactivity disorder, predominantly inattentive type: Secondary | ICD-10-CM | POA: Diagnosis not present

## 2023-05-31 DIAGNOSIS — Z419 Encounter for procedure for purposes other than remedying health state, unspecified: Secondary | ICD-10-CM | POA: Diagnosis not present

## 2023-06-28 DIAGNOSIS — Z419 Encounter for procedure for purposes other than remedying health state, unspecified: Secondary | ICD-10-CM | POA: Diagnosis not present

## 2023-08-09 DIAGNOSIS — Z419 Encounter for procedure for purposes other than remedying health state, unspecified: Secondary | ICD-10-CM | POA: Diagnosis not present

## 2023-09-08 DIAGNOSIS — Z419 Encounter for procedure for purposes other than remedying health state, unspecified: Secondary | ICD-10-CM | POA: Diagnosis not present

## 2023-10-09 DIAGNOSIS — Z419 Encounter for procedure for purposes other than remedying health state, unspecified: Secondary | ICD-10-CM | POA: Diagnosis not present

## 2023-10-14 DIAGNOSIS — B372 Candidiasis of skin and nail: Secondary | ICD-10-CM | POA: Diagnosis not present

## 2023-10-22 DIAGNOSIS — R21 Rash and other nonspecific skin eruption: Secondary | ICD-10-CM | POA: Diagnosis not present

## 2023-10-28 DIAGNOSIS — E782 Mixed hyperlipidemia: Secondary | ICD-10-CM | POA: Diagnosis not present

## 2023-11-04 DIAGNOSIS — F900 Attention-deficit hyperactivity disorder, predominantly inattentive type: Secondary | ICD-10-CM | POA: Diagnosis not present

## 2023-11-04 DIAGNOSIS — F321 Major depressive disorder, single episode, moderate: Secondary | ICD-10-CM | POA: Diagnosis not present

## 2023-11-04 DIAGNOSIS — R7303 Prediabetes: Secondary | ICD-10-CM | POA: Diagnosis not present

## 2023-11-04 DIAGNOSIS — E782 Mixed hyperlipidemia: Secondary | ICD-10-CM | POA: Diagnosis not present

## 2023-11-06 DIAGNOSIS — L239 Allergic contact dermatitis, unspecified cause: Secondary | ICD-10-CM | POA: Diagnosis not present

## 2023-11-06 DIAGNOSIS — D485 Neoplasm of uncertain behavior of skin: Secondary | ICD-10-CM | POA: Diagnosis not present

## 2023-11-06 DIAGNOSIS — R208 Other disturbances of skin sensation: Secondary | ICD-10-CM | POA: Diagnosis not present

## 2023-11-06 DIAGNOSIS — L2989 Other pruritus: Secondary | ICD-10-CM | POA: Diagnosis not present

## 2023-11-06 DIAGNOSIS — L918 Other hypertrophic disorders of the skin: Secondary | ICD-10-CM | POA: Diagnosis not present

## 2023-11-08 DIAGNOSIS — Z419 Encounter for procedure for purposes other than remedying health state, unspecified: Secondary | ICD-10-CM | POA: Diagnosis not present

## 2023-12-01 DIAGNOSIS — L2989 Other pruritus: Secondary | ICD-10-CM | POA: Diagnosis not present

## 2023-12-01 DIAGNOSIS — L239 Allergic contact dermatitis, unspecified cause: Secondary | ICD-10-CM | POA: Diagnosis not present

## 2023-12-09 DIAGNOSIS — Z419 Encounter for procedure for purposes other than remedying health state, unspecified: Secondary | ICD-10-CM | POA: Diagnosis not present

## 2023-12-10 DIAGNOSIS — R7303 Prediabetes: Secondary | ICD-10-CM | POA: Diagnosis not present

## 2023-12-10 DIAGNOSIS — F900 Attention-deficit hyperactivity disorder, predominantly inattentive type: Secondary | ICD-10-CM | POA: Diagnosis not present

## 2023-12-10 DIAGNOSIS — F321 Major depressive disorder, single episode, moderate: Secondary | ICD-10-CM | POA: Diagnosis not present

## 2024-01-01 DIAGNOSIS — L821 Other seborrheic keratosis: Secondary | ICD-10-CM | POA: Diagnosis not present

## 2024-01-01 DIAGNOSIS — D225 Melanocytic nevi of trunk: Secondary | ICD-10-CM | POA: Diagnosis not present

## 2024-01-01 DIAGNOSIS — Z7189 Other specified counseling: Secondary | ICD-10-CM | POA: Diagnosis not present

## 2024-01-01 DIAGNOSIS — D2271 Melanocytic nevi of right lower limb, including hip: Secondary | ICD-10-CM | POA: Diagnosis not present

## 2024-01-09 DIAGNOSIS — Z419 Encounter for procedure for purposes other than remedying health state, unspecified: Secondary | ICD-10-CM | POA: Diagnosis not present

## 2024-02-08 DIAGNOSIS — Z419 Encounter for procedure for purposes other than remedying health state, unspecified: Secondary | ICD-10-CM | POA: Diagnosis not present

## 2024-03-10 DIAGNOSIS — Z419 Encounter for procedure for purposes other than remedying health state, unspecified: Secondary | ICD-10-CM | POA: Diagnosis not present
